# Patient Record
Sex: Male | Born: 1955 | Race: Asian | Hispanic: No | Marital: Married | Smoking: Former smoker
Health system: Southern US, Community
[De-identification: ages and names within clinical notes are randomized; demographics above are authoritative.]

## PROBLEM LIST (undated history)

## (undated) DIAGNOSIS — T7840XA Allergy, unspecified, initial encounter: Secondary | ICD-10-CM

## (undated) DIAGNOSIS — R0683 Snoring: Secondary | ICD-10-CM

## (undated) DIAGNOSIS — N200 Calculus of kidney: Secondary | ICD-10-CM

## (undated) DIAGNOSIS — I10 Essential (primary) hypertension: Secondary | ICD-10-CM

## (undated) DIAGNOSIS — N201 Calculus of ureter: Secondary | ICD-10-CM

## (undated) DIAGNOSIS — R519 Headache, unspecified: Secondary | ICD-10-CM

## (undated) DIAGNOSIS — Z87442 Personal history of urinary calculi: Secondary | ICD-10-CM

## (undated) DIAGNOSIS — I700 Atherosclerosis of aorta: Secondary | ICD-10-CM

## (undated) HISTORY — PX: URETEROLITHOTOMY: SHX71

---

## 2016-05-02 ENCOUNTER — Ambulatory Visit (INDEPENDENT_AMBULATORY_CARE_PROVIDER_SITE_OTHER): Payer: 59

## 2016-05-02 ENCOUNTER — Ambulatory Visit (INDEPENDENT_AMBULATORY_CARE_PROVIDER_SITE_OTHER): Payer: 59 | Admitting: Physician Assistant

## 2016-05-02 VITALS — BP 142/100 | HR 84 | Temp 98.0°F | Ht 69.0 in | Wt 162.6 lb

## 2016-05-02 DIAGNOSIS — M25512 Pain in left shoulder: Secondary | ICD-10-CM | POA: Diagnosis not present

## 2016-05-02 DIAGNOSIS — R030 Elevated blood-pressure reading, without diagnosis of hypertension: Secondary | ICD-10-CM | POA: Diagnosis not present

## 2016-05-02 MED ORDER — PREDNISONE 20 MG PO TABS: ORAL_TABLET | ORAL | 0 refills | Status: DC

## 2016-05-02 NOTE — Progress Notes (Signed)
George Bishop  MRN: [id number] DOB: [birth year]  Subjective:  George Bishop is a 60 y.o. male seen in office today for a chief complaint of left shoulder pain x 3 weeks. Has associated intermittent tingling in left hand and shooting pain in left arm in certain positions. Pain is worsened when he reaches with left hand or when he lies on the affected side. Denies loss of ROM and weakness. 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 Notes that he noticed the pain 3 weeks ago after pulling a large box of ginger from under a counter with his left arm. Also notes that a ~15lb sushi container he was reaching for on a shelf fell and hit his left neck and shoulder 1 week ago. Has tried ibuprofen every 6 hours and pain patches for the past 3 weeks with no relief. States that he will get some relief when he lifts his arm over his head and holds it there.   Review of Systems  Constitutional: Negative for chills, diaphoresis and fever.  Respiratory: Negative for cough and shortness of breath.   Cardiovascular: Negative for chest pain, palpitations and leg swelling.  Gastrointestinal: Negative for nausea and vomiting.  Psychiatric/Behavioral: Negative for confusion.    There are no active problems to display for this patient.   No current outpatient prescriptions on file prior to visit.   No current facility-administered medications on file prior to visit.     No Known Allergies     History reviewed. No pertinent surgical history.  Social History   Social History  . Marital status: Married    Spouse name: N/A  . Number of children: N/A  . Years of education: N/A   Occupational History  . Not on file.   Social History Main Topics  . Smoking status: Never Smoker  . Smokeless tobacco: Never Used  . Alcohol use No  . Drug use: No  . Sexual activity: Not on file   Other Topics Concern  . Not on file   Social History Narrative    . No narrative on file    Objective:  BP (!) 142/100 (BP Location: Left Arm, Patient Position: Sitting, Cuff Size: Normal)   Pulse 84   Temp 98 F (36.7 C) (Oral)   Ht 5\' 9"  (1.753 m)   Wt 162 lb 9.6 oz (73.8 kg)   SpO2 97%   BMI 24.01 kg/m   Physical Exam  Constitutional: He is oriented to person, place, and time and well-developed, well-nourished, and in no distress.  HENT:  Head: Normocephalic and atraumatic.  Eyes: Conjunctivae are normal.  Neck: Normal range of motion.  Pulmonary/Chest: Effort normal.  Musculoskeletal:       Right shoulder: Normal.       Left shoulder: He exhibits tenderness (with palpation of muscle). He exhibits normal range of motion, no bony tenderness, normal pulse and normal strength.       Right elbow: Normal.      Left elbow: Tenderness found. Medial epicondyle tenderness noted.       Right wrist: Normal.       Left wrist: Normal.       Cervical back: He exhibits tenderness (tenderness of left shoulder with flexion and left lateral flexion of cervical region). He exhibits normal range of motion.       Thoracic back: Normal.       Left upper arm: He exhibits  tenderness.       Left forearm: Normal.  Positive Spurling test to the left Positive left should abduction test  Neurological: He is alert and oriented to person, place, and time. He has normal sensation. Gait normal.  Skin: Skin is warm and dry.  Psychiatric: Affect normal.  Vitals reviewed.  No results found for this or any previous visit (from the past 24 hour(s)).  Dg Cervical Spine Complete  Result Date: 05/02/2016 CLINICAL DATA:  15 pounds box fell on neck and left shoulder EXAM: CERVICAL SPINE - COMPLETE 4+ VIEW COMPARISON:  None. FINDINGS: Five views of the cervical spine submitted. No acute fracture or subluxation. Mild disc space flattening with anterior spurring at C5-C6 level. No prevertebral soft tissue swelling. Cervical airway is patent. IMPRESSION: No acute fracture or  subluxation. 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 Electronically Signed   By: Natasha MeadLiviu  Pop M.D.   On: 05/02/2016 17:48   Dg Shoulder Left  Result Date: 05/02/2016 CLINICAL DATA:  Acute left shoulder pain with radiculopathy radiating to the left upper extremity after injury 10 days prior. EXAM: LEFT SHOULDER - 2+ VIEW COMPARISON:  None. FINDINGS: There is no evidence of fracture or dislocation. There is no evidence of arthropathy or other focal bone abnormality. Soft tissues are unremarkable. IMPRESSION: Negative. Electronically Signed   By: Delbert PhenixJason A Poff M.D.   On: 05/02/2016 17:48   No results found for this or any previous visit (from the past 24 hour(s)).  POCT glucose: 96, taken by CMA Remi HaggardJulie Greer Assessment and Plan :  1. Elevated blood pressure reading -Likely due to pain. Will reevaluate at his follow up appointment in 2 weeks as he has never had high bp in the past.   2. Acute pain of left shoulder -History and physical exam consistent with cervical radiculopathy. Will treat with steroids. Pt instructed to follow up in 2 weeks for reevaluation, may warrant an orthopedic referral at this time if no relief. Return sooner if symptoms worsen.  - DG Cervical Spine Complete; Future - DG Shoulder Left; Future - predniSONE (DELTASONE) 20 MG tablet; 3-3-3-2-2-2-1-1-1 taper. Take all pills in the am with food.  Dispense: 18 tablet; Refill: 0 - POCT glucose (manual entry)  Benjiman CoreBrittany Wiseman PA-C  [facility] 05/02/2016 8:17 PM

## 2016-05-02 NOTE — Patient Instructions (Addendum)
Take prednisone as prescribed. Use ice on your neck and shoulder at least 3 times a day for 20 minutes at a time.   Return to clinic in 2 weeks for blood pressure follow up. We will reevaluated your shoulder pain at this point. If no improvement, you may benefit from an ortho referral.   If symptoms worsen, seek care sooner.    Cervical Radiculopathy Introduction Cervical radiculopathy means that a nerve in the neck is pinched or bruised. This can cause pain or loss of feeling (numbness) that runs from your neck to your arm and fingers. Follow these instructions at home: Managing pain  Take over-the-counter and prescription medicines only as told by your doctor.  If directed, put ice on the injured or painful area.  Put ice in a plastic bag.  Place a towel between your skin and the bag.  Leave the ice on for 20 minutes, 2-3 times per day.  If ice does not help, you can try using heat. Take a warm shower or warm bath, or use a heat pack as told by your doctor.  You may try a gentle neck and shoulder massage. Activity  Rest as needed. Follow instructions from your doctor about any activities to avoid.  Do exercises as told by your doctor or physical therapist. General instructions  If you were given a soft collar, wear it as told by your doctor.  Use a flat pillow when you sleep.  Keep all follow-up visits as told by your doctor. This is important. Contact a doctor if:  Your condition does not improve with treatment. Get help right away if:  Your pain gets worse and is not controlled with medicine.  You lose feeling or feel weak in your hand, arm, face, or leg.  You have a fever.  You have a stiff neck.  You cannot control when you poop or pee (have incontinence).  You have trouble with walking, balance, or talking. This information is not intended to replace advice given to you by your health care provider. Make sure you discuss any questions you have with your  health care provider. Document Released: 04/11/2011 Document Revised: 09/28/2015 Document Reviewed: 06/16/2014  2017 Elsevier    IF you received an x-ray today, you will receive an invoice from [facility]. Please contact [facility] at [phone] with questions or concerns regarding your invoice.   IF you received labwork today, you will receive an invoice from [facility]. Please contact [facility] at [phone] with questions or concerns regarding your invoice.   Our billing staff will not be able to assist you with questions regarding bills from these companies.  You will be contacted with the lab results as soon as they are available. The fastest way to get your results is to activate your My Chart account. Instructions are located on the last page of this paperwork. If you have not heard from us regarding the results in 2 weeks, please contact this office.

## 2016-05-25 ENCOUNTER — Ambulatory Visit (INDEPENDENT_AMBULATORY_CARE_PROVIDER_SITE_OTHER): Payer: 59 | Admitting: Family Medicine

## 2016-05-25 VITALS — BP 122/72 | HR 91 | Temp 98.3°F | Resp 17 | Ht 69.5 in | Wt 165.0 lb

## 2016-05-25 DIAGNOSIS — M5410 Radiculopathy, site unspecified: Secondary | ICD-10-CM | POA: Diagnosis not present

## 2016-05-25 DIAGNOSIS — G5692 Unspecified mononeuropathy of left upper limb: Secondary | ICD-10-CM

## 2016-05-25 NOTE — Progress Notes (Signed)
Subjective:  By signing my name below, I, Stann Oresung-Kai Tsai, attest that this documentation has been prepared under the direction and in the presence of Norberto SorensonEva Shaw, MD. Electronically Signed: Stann Oresung-Kai Tsai, Scribe. 05/25/2016 , 12:16 PM .  Patient was seen in Room 10 .   Patient ID: George Bishop, male    DOB: Aug 02, 1955, 61 y.o.   MRN: [id number] Chief Complaint  Patient presents with  . Shoulder Pain    left side    HPI George Bishop is a 61 y.o. male who [facility] complaining of left sided shoulder pain. He was seen by Benjiman CoreBrittany Wiseman, PA-C 3 weeks ago for left shoulder pain with intermittent radiculopathy symptoms. It began after several work related mechanical injuries and presented with failed response to routine anti-inflammatories. He was put on 9-day prednisone 60mg  taper. His C-spine xray showed mild degenerative change at C5-C6 and normal left shoulder. His BP is improved today.   Patient reports that he took prednisone, but no relief from it. He's been taking 2 tablets of ibuprofen 2~3 times a day for some mild relief. He is able to have rest and sleep after taking ibuprofen at night. He also reports some pain and tingling down into his left 1st, 2nd through 5th digits in ulnar aspect. He denies any abdominal pain or heartburn. He's been drinking plenty of water; denies any alcohol use. He denies prior injuries similar to this. He is right hand dominant.   Patient's primary language is Burmese, but is able to speak and respond to [place]. A [employer] video interpreter was called for some assistance.   No past medical history on file. Prior to Admission medications   Medication Sig Start Date End Date Taking? Authorizing Provider  ibuprofen (ADVIL,MOTRIN) 200 MG tablet Take 200 mg by mouth every 6 (six) hours as needed.   Yes Historical Provider, MD   No Known Allergies  Review of Systems  Constitutional: Negative for fatigue and unexpected weight  change.  Eyes: Negative for visual disturbance.  Respiratory: Negative for cough, chest tightness and shortness of breath.   Cardiovascular: Negative for chest pain, palpitations and leg swelling.  Gastrointestinal: Negative for abdominal pain and blood in stool.  Musculoskeletal: Positive for arthralgias, myalgias and neck pain.  Neurological: Positive for numbness. Negative for dizziness, light-headedness and headaches.       Objective:   Physical Exam  Constitutional: He is oriented to person, place, and time. He appears well-developed and well-nourished. No distress.  HENT:  Head: Normocephalic and atraumatic.  Eyes: EOM are normal. Pupils are equal, round, and reactive to light.  Neck: Neck supple.  Cardiovascular: Normal rate.   Pulmonary/Chest: Effort normal. No respiratory distress.  Musculoskeletal: Normal range of motion.  No tenderness in cervical spinous process, very spasmed, tight left cervical paraspinal, positive Spurling test; flexion and left rotation of c-spine causes pain in upper rhomboid medial to the scapula, which does have palpable swelling and spasm; tenderness to palpation above his mid-scapula inside the triangular muscle openings; point tenderness just at distal posterior aspect of the axilla; 5/5 grasp strength wrist extension, flexion of bicep and tricep  Neurological: He is alert and oriented to person, place, and time.  Reflex Scores:      Tricep reflexes are 2+ on the right side and 1+ on the left side.      Bicep reflexes are 2+ on the right side and 1+ on the left side.  Brachioradialis reflexes are 2+ on the right side and 1+ on the left side. Left DTR: trace  Skin: Skin is warm and dry.  Psychiatric: He has a normal mood and affect. His behavior is normal.  Nursing note and vitals reviewed.   BP 122/72 (BP Location: Right Arm, Patient Position: Sitting, Cuff Size: Normal)   Pulse 91   Temp 98.3 F (36.8 C) (Oral)   Resp 17   Ht 5' 9.5"  (1.765 m)   Wt 165 lb (74.8 kg)   SpO2 98%   BMI 24.02 kg/m     Assessment & Plan:   1. Radiculopathy affecting upper extremity   2. Neuropathy of upper extremity, left     Orders Placed This Encounter  Procedures  . Ambulatory referral to Orthopedic Surgery    Referral Priority:   Urgent    Referral Type:   Surgical    Referral Reason:   Specialty Services Required    Requested Specialty:   Orthopedic Surgery    Number of Visits Requested:   1     I personally performed the services described in this documentation, which was scribed in my presence. The recorded information has been reviewed and considered, and addended by me as needed.   Norberto Sorenson, M.D.  [facility] [address] [place], [place] [address] [phone] phone [phone] fax  06/04/16 10:23 PM

## 2016-05-25 NOTE — Patient Instructions (Addendum)
   IF you received an x-ray today, you will receive an invoice from [facility]. Please contact [facility] at [phone] with questions or concerns regarding your invoice.   IF you received labwork today, you will receive an invoice from [facility]. Please contact [facility] at [phone] with questions or concerns regarding your invoice.   Our billing staff will not be able to assist you with questions regarding bills from these companies.  You will be contacted with the lab results as soon as they are available. The fastest way to get your results is to activate your My Chart account. Instructions are located on the last page of this paperwork. If you have not heard from us regarding the results in 2 weeks, please contact this office.     Pinched Nerve Introduction A pinched nerve is a type of injury that occurs when too much pressure is placed on a nerve. This pressure can cause pain, burning, and muscle weakness in places such as your arm, hand, back, leg, or neck. A nerve can become permanently damaged if it is severely pinched or if it has been pinched for a long time. What are the causes? This condition may be caused by:  The passing of a nerve through a narrow area between bones or other body structures.  Loss of blood supply to a nerve.  A nerve being stretched from an injury.  A sudden injury with swelling.  Wear and tear that occurs over several years.  Changes that occur in the spine with age. What are the signs or symptoms? The most common symptom of a pinched nerve is a tingling feeling or numbness. Other symptoms include:  Pain that radiates from the affected nerve to the body part that the nerve supplies.  A burning feeling.  Muscle weakness in the muscles supplied by the injured nerve. How is this diagnosed? This condition is diagnosed with a physical exam. During the exam, a health care provider will check for numbness and muscle weakness  and move the affected body parts to test for pain. You may also have other tests, such as:  X-rays to check for bone damage.  An MRI or CT scan to check for nerve damage.  Electromyography (EMG) to check for electrical signals passing through nerves to muscles. How is this treated? The first treatment for a pinched nerve is usually rest and supportive devices, such as a splint, brace, or neck collar. Additional treatment depends on symptoms and the amount of nerve damage. This can include:  Medicines, such as:  Numbing medicine injections.  Nonsteroidal anti-inflammatory drugs (NSAIDs).  Steroid medicines in pill form or by injection.  Physical therapy to relieve pain, maintain movement, and improve muscle strength.  Surgery. This may be done if other treatments do not work. Follow these instructions at home:  Only take medicines as directed by your health care provider.  Wear supportive or protective devices as directed by your health care provider.  Do stretching and strengthening exercises at home as directed by your health care provider.  Rest as needed.  Keep all follow-up visits as directed by your health care provider. This is important. Contact a health care provider if:  Your condition does not improve with treatment.  Your pain, numbness, or weakness suddenly gets worse. This information is not intended to replace advice given to you by your health care provider. Make sure you discuss any questions you have with your health care provider. Document Released: 04/12/2002 Document Revised: 09/28/2015   Document Reviewed: 01/26/2014  2017 Elsevier  

## 2016-06-03 ENCOUNTER — Ambulatory Visit (INDEPENDENT_AMBULATORY_CARE_PROVIDER_SITE_OTHER): Payer: 59 | Admitting: Orthopaedic Surgery

## 2016-06-03 ENCOUNTER — Encounter (INDEPENDENT_AMBULATORY_CARE_PROVIDER_SITE_OTHER): Payer: Self-pay | Admitting: Orthopaedic Surgery

## 2016-06-03 DIAGNOSIS — M5412 Radiculopathy, cervical region: Secondary | ICD-10-CM | POA: Insufficient documentation

## 2016-06-03 MED ORDER — CYCLOBENZAPRINE HCL 5 MG PO TABS: 5.0000 mg | ORAL_TABLET | Freq: Three times a day (TID) | ORAL | 3 refills | Status: DC | PRN

## 2016-06-03 NOTE — Progress Notes (Signed)
    Office Visit Note   Patient: George Bishop           Date of Birth: 1955-12-05           MRN: [id number] Visit Date:               Requested by: Sherren MochaEva N Shaw, MD [address] [place], [place] [address] PCP: No primary care provider on file.   Assessment & Plan: Visit Diagnoses:  1. Cervical radiculopathy   2. Acute pain of left shoulder     Plan: Recommend MRI of cervical spine to evaluate for structural abnormality. Follow-up after the MRI  Follow-Up Instructions: Return in about 2 weeks (around 06/17/2016).   Orders:  No orders of the defined types were placed in this encounter.  Meds ordered this encounter  Medications  . DISCONTD: cyclobenzaprine (FLEXERIL) 5 MG tablet    Sig: Take 1-2 tablets (5-10 mg total) by mouth 3 (three) times daily as needed for muscle spasms.    Dispense:  30 tablet    Refill:  3  . cyclobenzaprine (FLEXERIL) 5 MG tablet    Sig: Take 1-2 tablets (5-10 mg total) by mouth 3 (three) times daily as needed for muscle spasms.    Dispense:  30 tablet    Refill:  3      Procedures: No procedures performed   Clinical Data: No additional findings.   Subjective: Chief Complaint  Patient presents with  . Left Shoulder - Pain    Patient is a Burmese 61 year old gentleman who has had cervical radiculitis into his left upper extremity for about 2 months. He's been treated conservatively by urgent care with NSAIDs and prednisone taper with minimal relief. Patient denies any focal motor or sensory deficits. He endorses muscular pain. Denies any constitutional symptoms or injuries.    Review of Systems Complete review of systems negative except for history of present illness  Objective: Vital Signs: There were no vitals taken for this visit.  Physical Exam  Constitutional: He is oriented to person, place, and time. He appears well-developed and well-nourished.  HENT:  Head: Normocephalic and atraumatic.  Eyes: Pupils are equal, round,  and reactive to light.  Neck: Neck supple.  Pulmonary/Chest: Effort normal.  Abdominal: Soft.  Musculoskeletal: Normal range of motion.  Neurological: He is alert and oriented to person, place, and time.  Skin: Skin is warm.  Psychiatric: He has a normal mood and affect. His behavior is normal. Judgment and thought content normal.  Nursing note and vitals reviewed.   Ortho Exam Exam of the cervical spine shows normal range of motion. Negative Spurling sign. He has no focal deficits. Normal reflexes. Shoulder exam is normal. Specialty Comments:  No specialty comments available.  Imaging: No results found.   PMFS History: Patient Active Problem List   Diagnosis Date Noted  . Cervical radiculopathy 06/03/2016   No past medical history on file.  No family history on file.  No past surgical history on file. Social History   Occupational History  . Not on file.   Social History Main Topics  . Smoking status: Never Smoker  . Smokeless tobacco: Never Used  . Alcohol use No  . Drug use: No  . Sexual activity: Not on file

## 2016-06-13 ENCOUNTER — Ambulatory Visit
Admission: RE | Admit: 2016-06-13 | Discharge: 2016-06-13 | Disposition: A | Discharge status: Home / Self Care | Payer: 59 | Source: Ambulatory Visit | Attending: Orthopaedic Surgery | Admitting: Orthopaedic Surgery

## 2016-06-13 DIAGNOSIS — M5412 Radiculopathy, cervical region: Secondary | ICD-10-CM

## 2016-06-17 ENCOUNTER — Ambulatory Visit (INDEPENDENT_AMBULATORY_CARE_PROVIDER_SITE_OTHER): Payer: 59 | Admitting: Orthopaedic Surgery

## 2016-06-17 ENCOUNTER — Encounter (INDEPENDENT_AMBULATORY_CARE_PROVIDER_SITE_OTHER): Payer: Self-pay | Admitting: Orthopaedic Surgery

## 2016-06-17 DIAGNOSIS — M5412 Radiculopathy, cervical region: Secondary | ICD-10-CM

## 2016-06-17 MED ORDER — PREDNISONE 10 MG (21) PO TBPK: ORAL_TABLET | ORAL | 0 refills | Status: DC

## 2016-06-17 MED ORDER — MELOXICAM 7.5 MG PO TABS: 7.5000 mg | ORAL_TABLET | Freq: Two times a day (BID) | ORAL | 2 refills | Status: DC | PRN

## 2016-06-17 NOTE — Progress Notes (Signed)
   Office Visit Note   Patient: George Bishop           Date of Birth: 04/20/1956           MRN: [id number] Visit Date: 06/17/2016              Requested by: No referring provider defined for this encounter. PCP: No PCP Per Patient   Assessment & Plan: Visit Diagnoses:  1. Cervical radiculopathy     Plan: pred pak, mobic, rest.  If not better in 4 weeks patient will call and we will refer [facility] Alvester [facility]Newton [facility]  Follow-Up Instructions: Return in about 4 weeks (around 07/15/2016), or if symptoms worsen or fail to improve.   Orders:  No orders of the defined types were placed in this encounter.  Meds ordered this encounter  Medications  . predniSONE (STERAPRED UNI-PAK 21 TAB) 10 MG (21) TBPK tablet    Sig: Take as directed    Dispense:  21 tablet    Refill:  0  . meloxicam (MOBIC) 7.5 MG tablet    Sig: Take 1 tablet (7.5 mg total) by mouth 2 (two) times daily as needed for pain.    Dispense:  30 tablet    Refill:  2      Procedures: No procedures performed   Clinical Data: No additional findings.   Subjective: Chief Complaint  Patient presents with  . Neck - Pain, Follow-up    Patient f/u today for cervical radiculopathy feeling slightly better.  Here to review MRI.    Review of Systems   Objective: Vital Signs: There were no vitals taken for this visit.  Physical Exam  Ortho Exam Exam shows no focal weakness or sensory deficits  Specialty Comments:  No specialty comments available.  Imaging: No results found.   PMFS History: Patient Active Problem List   Diagnosis Date Noted  . Cervical radiculopathy 06/03/2016   History reviewed. No pertinent past medical history.  History reviewed. No pertinent family history.  History reviewed. No pertinent surgical history. Social History   Occupational History  . Not on file.   Social History Main Topics  . Smoking status: Never Smoker  . Smokeless tobacco: Never Used  . Alcohol use No  .  Drug use: No  . Sexual activity: Not on file

## 2016-08-05 ENCOUNTER — Other Ambulatory Visit (INDEPENDENT_AMBULATORY_CARE_PROVIDER_SITE_OTHER): Payer: Self-pay | Admitting: Orthopaedic Surgery

## 2016-08-06 NOTE — Telephone Encounter (Signed)
Please advise 

## 2017-09-22 ENCOUNTER — Ambulatory Visit: Payer: BLUE CROSS/BLUE SHIELD | Admitting: Family Medicine

## 2017-09-22 ENCOUNTER — Other Ambulatory Visit: Payer: Self-pay

## 2017-09-22 ENCOUNTER — Encounter: Payer: Self-pay | Admitting: Family Medicine

## 2017-09-22 VITALS — BP 141/94 | HR 98 | Temp 99.3°F | Resp 17 | Ht 69.5 in | Wt 154.6 lb

## 2017-09-22 DIAGNOSIS — J029 Acute pharyngitis, unspecified: Secondary | ICD-10-CM

## 2017-09-22 DIAGNOSIS — H6983 Other specified disorders of Eustachian tube, bilateral: Secondary | ICD-10-CM

## 2017-09-22 DIAGNOSIS — J301 Allergic rhinitis due to pollen: Secondary | ICD-10-CM

## 2017-09-22 LAB — POCT RAPID STREP A (OFFICE): Rapid Strep A Screen: NEGATIVE

## 2017-09-22 MED ORDER — CETIRIZINE HCL 10 MG PO TABS: 10.0000 mg | ORAL_TABLET | Freq: Every day | ORAL | 11 refills | Status: DC

## 2017-09-22 MED ORDER — PREDNISONE 20 MG PO TABS: 40.0000 mg | ORAL_TABLET | Freq: Every day | ORAL | 0 refills | Status: DC

## 2017-09-22 MED ORDER — FLUTICASONE PROPIONATE 50 MCG/ACT NA SUSP
2.0000 | Freq: Every day | NASAL | 6 refills | Status: DC
Start: 2017-09-22 — End: 2018-01-02

## 2017-09-22 NOTE — Patient Instructions (Addendum)
Take prednisone  (that is 2 tablets) with food in the morning for 4 days This will help the congestion, ear fullness and cough  Take cetrizine  at bedtime  Take flonase 2 sprays each nostril at bedtime   Continue tylenol as needed    IF you received an x-ray today, you will receive an invoice from [facility]. Please contact [facility] at [phone] with questions or concerns regarding your invoice.   IF you received labwork today, you will receive an invoice from [place]. Please contact [facility] at [phone] with questions or concerns regarding your invoice.   Our billing staff will not be able to assist you with questions regarding bills from these companies.  You will be contacted with the lab results as soon as they are available. The fastest way to get your results is to activate your My Chart account. Instructions are located on the last page of this paperwork. If you have not heard from [place] regarding the results in 2 weeks, please contact this office.     Upper Respiratory Infection, Adult Most upper respiratory infections (URIs) are a viral infection of the air passages leading to the lungs. A URI affects the nose, throat, and upper air passages. The most common type of URI is nasopharyngitis and is typically referred to as "the common cold." URIs run their course and usually go away on their own. Most of the time, a URI does not require medical attention, but sometimes a bacterial infection in the upper airways can follow a viral infection. This is called a secondary infection. Sinus and middle ear infections are common types of secondary upper respiratory infections. Bacterial pneumonia can also complicate a URI. A URI can worsen asthma and chronic obstructive pulmonary disease (COPD). Sometimes, these complications can require emergency medical care and may be life threatening. What are the causes? Almost all URIs are caused by viruses. A virus is a  type of germ and can spread from one person to another. What increases the risk? You may be at risk for a URI if:  You smoke.  You have chronic heart or lung disease.  You have a weakened defense (immune) system.  You are very young or very old.  You have nasal allergies or asthma.  You work in crowded or poorly ventilated areas.  You work in health care facilities or schools.  What are the signs or symptoms? Symptoms typically develop 2-3 days after you come in contact with a cold virus. Most viral URIs last 7-10 days. However, viral URIs from the influenza virus (flu virus) can last 14-18 days and are typically more severe. Symptoms may include:  Runny or stuffy (congested) nose.  Sneezing.  Cough.  Sore throat.  Headache.  Fatigue.  Fever.  Loss of appetite.  Pain in your forehead, behind your eyes, and over your cheekbones (sinus pain).  Muscle aches.  How is this diagnosed? Your health care provider may diagnose a URI by:  Physical exam.  Tests to check that your symptoms are not due to another condition such as: ? Strep throat. ? Sinusitis. ? Pneumonia. ? Asthma.  How is this treated? A URI goes away on its own with time. It cannot be cured with medicines, but medicines may be prescribed or recommended to relieve symptoms. Medicines may help:  Reduce your fever.  Reduce your cough.  Relieve nasal congestion.  Follow these instructions at home:  Take medicines only as directed by your health care provider.  Gargle warm  saltwater or take cough drops to comfort your throat as directed by your health care provider.  Use a warm mist humidifier or inhale steam from a shower to increase air moisture. This may make it easier to breathe.  Drink enough fluid to keep your urine clear or pale yellow.  Eat soups and other clear broths and maintain good nutrition.  Rest as needed.  Return to work when your temperature has returned to normal or as  your health care provider advises. You may need to stay home longer to avoid infecting others. You can also use a face mask and careful hand washing to prevent spread of the virus.  Increase the usage of your inhaler if you have asthma.  Do not use any tobacco products, including cigarettes, chewing tobacco, or electronic cigarettes. If you need help quitting, ask your health care provider. How is this prevented? The best way to protect yourself from getting a cold is to practice good hygiene.  Avoid oral or hand contact with people with cold symptoms.  Wash your hands often if contact occurs.  There is no clear evidence that vitamin C, vitamin E, echinacea, or exercise reduces the chance of developing a cold. However, it is always recommended to get plenty of rest, exercise, and practice good nutrition. Contact a health care provider if:  You are getting worse rather than better.  Your symptoms are not controlled by medicine.  You have chills.  You have worsening shortness of breath.  You have brown or red mucus.  You have yellow or brown nasal discharge.  You have pain in your face, especially when you bend forward.  You have a fever.  You have swollen neck glands.  You have pain while swallowing.  You have white areas in the back of your throat. Get help right away if:  You have severe or persistent: ? Headache. ? Ear pain. ? Sinus pain. ? Chest pain.  You have chronic lung disease and any of the following: ? Wheezing. ? Prolonged cough. ? Coughing up blood. ? A change in your usual mucus.  You have a stiff neck.  You have changes in your: ? Vision. ? Hearing. ? Thinking. ? Mood. This information is not intended to replace advice given to you by your health care provider. Make sure you discuss any questions you have with your health care provider. Document Released: 10/16/2000 Document Revised: 12/24/2015 Document Reviewed: 07/28/2013 Elsevier Interactive  Patient Education  [employer].

## 2017-09-22 NOTE — Progress Notes (Signed)
Chief Complaint  Patient presents with  . Sore Throat    x 3-4 days, taking tylenol for symptoms  . Cough    x 3-4 days   Social History   Social History Narrative  . Not on file    Subjective: George Bishop is a 62 y.o. who comes in today for a URI. This has been going on for 4 days and feels like he got it from his wife The worst part is cough and sore throat . It is getting worse  he has had sick exposure(s) or recent travel.  Review of systems for fever is positive. Respiratory ROS: positive for - cough and shortness of breath ENT ROS: positive for - hearing change and nasal congestion negative for - epistaxis, headaches, nasal discharge, nasal polyps, tinnitus or vertigo   Physical Exam: Vitals:   09/22/17 1136  BP: (!) 141/94  Pulse: 98  Resp: 17  Temp: 99.3 F (37.4 C)  SpO2: 97%    General: alert, oriented, in NAD Head: normocephalic, atraumatic, no sinus tenderness Eyes: EOM intact, no scleral icterus or conjunctival injection Ears: TM clear bilaterally with bulging on the right and mild erythema in the external canal on the left Nose: mucosa +erythematous and edematous Throat: no pharyngeal exudate or erythema Lymph: + posterior auricular, +submental  No cervical lymph adenopathy Heart: normal rate, normal sinus rhythm, no murmurs Lungs: clear to auscultation bilaterally, no wheezing   Assessment and Plan: George Bishop was seen today for sore throat and cough.  Diagnoses and all orders for this visit:  Acute pharyngitis, unspecified etiology -     POCT rapid strep A -     predniSONE (DELTASONE) 20 MG tablet; Take 2 tablets (40 mg total) by mouth daily with breakfast for 4 days. -     fluticasone (FLONASE) 50 MCG/ACT nasal spray; Place 2 sprays into both nostrils daily. -     cetirizine (ZYRTEC) 10 MG tablet; Take 1 tablet (10 mg total) by mouth daily.  Seasonal allergic rhinitis due to pollen -     predniSONE (DELTASONE) 20 MG tablet; Take 2 tablets  (40 mg total) by mouth daily with breakfast for 4 days. -     fluticasone (FLONASE) 50 MCG/ACT nasal spray; Place 2 sprays into both nostrils daily. -     cetirizine (ZYRTEC) 10 MG tablet; Take 1 tablet (10 mg total) by mouth daily.  Dysfunction of both eustachian tubes -     predniSONE (DELTASONE) 20 MG tablet; Take 2 tablets (40 mg total) by mouth daily with breakfast for 4 days. -     fluticasone (FLONASE) 50 MCG/ACT nasal spray; Place 2 sprays into both nostrils daily. -     cetirizine (ZYRTEC) 10 MG tablet; Take 1 tablet (10 mg total) by mouth daily.    Rapid strep negative Take prednisone  (that is 2 tablets) with food in the morning for 4 days This will help the congestion, ear fullness and cough  Take cetrizine  at bedtime  Take flonase 2 sprays each nostril at bedtime   Continue tylenol as needed Return to clinic if worsens or new symptoms

## 2017-09-25 ENCOUNTER — Telehealth: Payer: Self-pay | Admitting: Family Medicine

## 2017-09-25 NOTE — Telephone Encounter (Signed)
Pt came in and states that he is still coughing at night time and it is hard for him to sleep.  He is asking if Creta Levin could call in something to help him sleep.  Pt was seen on the 20th of this month.  Please advise

## 2017-09-26 ENCOUNTER — Other Ambulatory Visit: Payer: Self-pay

## 2017-09-26 ENCOUNTER — Ambulatory Visit: Payer: BLUE CROSS/BLUE SHIELD | Admitting: Physician Assistant

## 2017-09-26 ENCOUNTER — Encounter: Payer: Self-pay | Admitting: Physician Assistant

## 2017-09-26 VITALS — BP 140/96 | HR 77 | Temp 97.9°F | Ht 69.0 in | Wt 158.2 lb

## 2017-09-26 DIAGNOSIS — R059 Cough, unspecified: Secondary | ICD-10-CM

## 2017-09-26 DIAGNOSIS — J40 Bronchitis, not specified as acute or chronic: Secondary | ICD-10-CM | POA: Diagnosis not present

## 2017-09-26 DIAGNOSIS — R05 Cough: Secondary | ICD-10-CM

## 2017-09-26 MED ORDER — AZITHROMYCIN 250 MG PO TABS: ORAL_TABLET | ORAL | 0 refills | Status: DC

## 2017-09-26 MED ORDER — BENZONATATE 200 MG PO CAPS: 200.0000 mg | ORAL_CAPSULE | Freq: Two times a day (BID) | ORAL | 0 refills | Status: DC | PRN

## 2017-09-26 MED ORDER — HYDROCODONE-HOMATROPINE 5-1.5 MG/5ML PO SYRP: 5.0000 mL | ORAL_SOLUTION | Freq: Three times a day (TID) | ORAL | 0 refills | Status: DC | PRN

## 2017-09-26 NOTE — Patient Instructions (Addendum)
Start taking Azithromycin - this is an antibiotic. Take all 5 days of this medicine, even if you start to feel better sooner.   To help calm your cough: Hycodan - take this as directed to help you sleep at night. This medicine will make you drowsy. Benzonatate - take this as needed during the day for cough. This will not make you drowsy.   Come back if you are not improving in 5-7 days.    Stay well hydrated. Get lost of rest. Wash your hands often.   -Foods that can help speed recovery: honey, garlic, chicken soup, elderberries, green tea.  -Supplements that can help speed recovery: vitamin C, zinc, elderberry extract, quercetin, ginseng, selenium -Supplement with prebiotics and probiotics:   Advil or ibuprofen for pain. Do not take Aspirin.  Drink enough water and fluids to keep your urine clear or pale yellow.  For sore throat: ? Gargle with 8 oz of salt water ( tsp of salt per 1 qt of water) as often as every 1-2 hours to soothe your throat.  Gargle liquid benadryl.  Cepacol throat lozenges (if you are not at risk for choking).  For sore throat try using a honey-based tea. Use 3 teaspoons of honey with juice squeezed from half lemon. Place shaved pieces of ginger into 1/2-1 cup of water and warm over stove top. Then mix the ingredients and repeat every 4 hours as needed.  Cough Syrup Recipe: Sweet Lemon & Honey Thyme  Ingredients a handful of fresh thyme sprigs   1 pint of water (2 cups)  1/2 cup honey (raw is best, but regular will do)  1/2 lemon chopped Instructions 1. Place the lemon in the pint jar and cover with the honey. The honey will macerate the lemons and draw out liquids which taste so delicious! 2. Meanwhile, toss the thyme leaves into a saucepan and cover them with the water. 3. Bring the water to a gentle simmer and reduce it to half, about a cup of tea. 4. When the tea is reduced and cooled a bit, strain the sprigs & leaves, add it into the pint jar and stir it  well. 5. Give it a shake and use a spoonful as needed. 6. Store your homemade cough syrup in the refrigerator for about a month.  What causes a cough? In adults, common causes of a cough include: ?An infection of the airways or lungs (such as the common cold) ?Postnasal drip - Postnasal drip is when mucus from the nose drips down or flows along the back of the throat. Postnasal drip can happen when people have: .A cold .Allergies .A sinus infection - The sinuses are hollow areas in the bones of the face that open into the nose. ?Lung conditions, like asthma and chronic obstructive pulmonary disease (COPD) - Both of these conditions can make it hard to breathe. COPD is usually caused by smoking. ?Acid reflux - Acid reflux is when the acid that is normally in your stomach backs up into your esophagus (the tube that carries food from your mouth to your stomach). ?A side effect from blood pressure medicines called "ACE inhibitors" ?Smoking cigarettes  Is there anything I can do on my own to get rid of my cough? Yes. To help get rid of your cough, you can: ?Use a humidifier in your bedroom ?Use an over-the-counter cough medicine, or suck on cough drops or hard candy ?Stop smoking, if you smoke ?If you have allergies, avoid the things you are  allergic to (like pollen, dust, animals, or mold) If you have acid reflux, your doctor or nurse will tell you which lifestyle changes can help reduce symptoms.      IF you received an x-ray today, you will receive an invoice from [facility]. Please contact [facility] at [phone] with questions or concerns regarding your invoice.   IF you received labwork today, you will receive an invoice from [place]. Please contact [facility] at [phone] with questions or concerns regarding your invoice.   Our billing staff will not be able to assist you with questions regarding bills from these companies.  You will be contacted with  the lab results as soon as they are available. The fastest way to get your results is to activate your My Chart account. Instructions are located on the last page of this paperwork. If you have not heard from [place] regarding the results in 2 weeks, please contact this office.

## 2017-09-26 NOTE — Progress Notes (Signed)
   George Bishop  MRN: [id number] DOB: June 05, 1955  PCP: Patient, No Pcp Per  Subjective:  Pt is a 62 year old male who presents to clinic for f/u cough. He was here on 5/20 and treated supportively for acute pharyngitis.  Today he c/o worsening cough. Cough has been presents for >8 days. Cough is productive and is keeping him up at night.  ROS below.   Review of Systems  Constitutional: Negative for chills, diaphoresis, fatigue and fever.  HENT: Positive for congestion.   Respiratory: Positive for cough and chest tightness. Negative for shortness of breath and wheezing.   Cardiovascular: Negative for chest pain, palpitations and leg swelling.  Psychiatric/Behavioral: Positive for sleep disturbance.    Patient Active Problem List   Diagnosis Date Noted  . Cervical radiculopathy 06/03/2016    Current Outpatient Medications on File Prior to Visit  Medication Sig Dispense Refill  . cetirizine (ZYRTEC) 10 MG tablet Take 1 tablet (10 mg total) by mouth daily. 30 tablet 11  . fluticasone (FLONASE) 50 MCG/ACT nasal spray Place 2 sprays into both nostrils daily. 16 g 6  . cyclobenzaprine (FLEXERIL) 5 MG tablet Take 1-2 tablets (5-10 mg total) by mouth 3 (three) times daily as needed for muscle spasms. (Patient not taking: Reported on 09/22/2017) 30 tablet 3  . meloxicam (MOBIC) 7.5 MG tablet TAKE 1 TABLET (7.5 MG TOTAL) BY MOUTH 2 (TWO) TIMES DAILY AS NEEDED FOR PAIN. (Patient not taking: Reported on 09/22/2017) 30 tablet 2   No current facility-administered medications on file prior to visit.     No Known Allergies   Objective:  BP (!) 140/94 (BP Location: Left Arm, Patient Position: Sitting, Cuff Size: Normal)   Pulse 77   Temp 97.9 F (36.6 C) (Oral)   Ht  (1.753 m)   Wt 158 lb 3.2 oz (71.8 kg)   SpO2 99%   BMI 23.36 kg/m   Physical Exam  Constitutional: He is oriented to person, place, and time. He appears well-developed and well-nourished.  Cardiovascular: Normal rate  and regular rhythm.  Pulmonary/Chest: Effort normal. No respiratory distress. He has rhonchi in the right lower field.  Neurological: He is alert and oriented to person, place, and time.  Skin: Skin is warm and dry.  Psychiatric: He has a normal mood and affect. His behavior is normal. Judgment and thought content normal.  Vitals reviewed.   Assessment and Plan :  1. Bronchitis - azithromycin (ZITHROMAX) 250 MG tablet; Take 2 tabs PO x 1 dose, then 1 tab PO QD x 4 days  Dispense: 6 tablet; Refill: 0 - pt presents for worsening cough x 8 days. He was here last week and treated supportively. Plan to cover with z-pack and treat supportively with hycodan, tessalon and hydration. RTC in 5-7 days if no improvement.  2. Cough - HYDROcodone-homatropine (HYCODAN) 5-1.5 MG/5ML syrup; Take 5 mLs by mouth every 8 (eight) hours as needed for cough.  Dispense: 120 mL; Refill: 0 - benzonatate (TESSALON) 200 MG capsule; Take 1 capsule (200 mg total) by mouth 2 (two) times daily as needed for cough.  Dispense: 20 capsule; Refill: 0   Marco Collie, PA-C  Primary [facility] at [facility] 09/26/2017 11:17 AM

## 2017-09-26 NOTE — Telephone Encounter (Signed)
Pt seen today by mcvey.

## 2017-10-01 IMAGING — MR MR CERVICAL SPINE W/O CM
4 of 5 series · 28 of 48 positions shown · non-contrast
Comparison: None.

CLINICAL DATA: Left-sided neck pain, arm pain for 2 months

EXAM:
MRI CERVICAL SPINE WITHOUT CONTRAST
TECHNIQUE: Multiplanar, multisequence MR imaging of the cervical spine was
performed. No intravenous contrast was administered.

[Series 3: T2 · sagittal · 3.3mm · 0.39mm/px · 6 of 12 slices shown (1 of 2)]
[im 1/12]
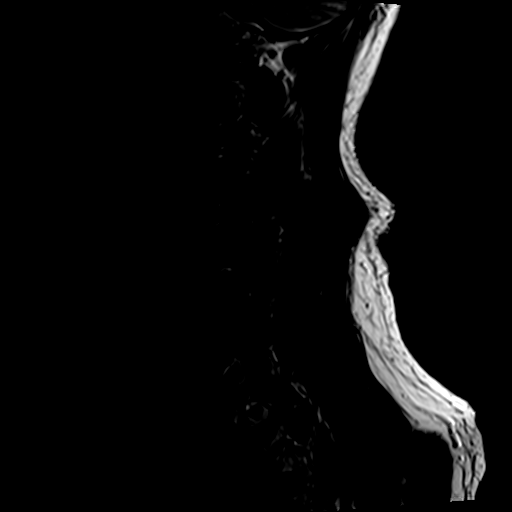
[im 3/12]
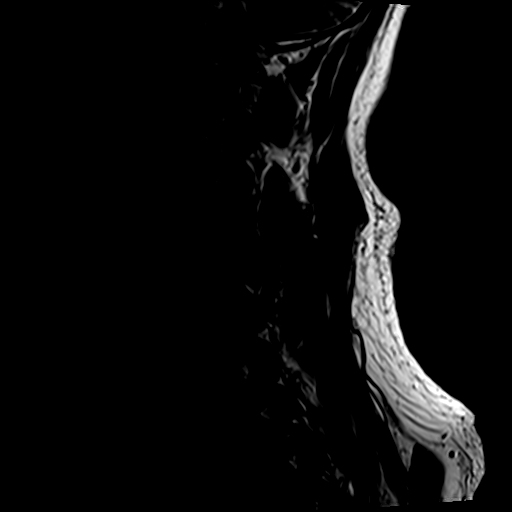
[im 5/12]
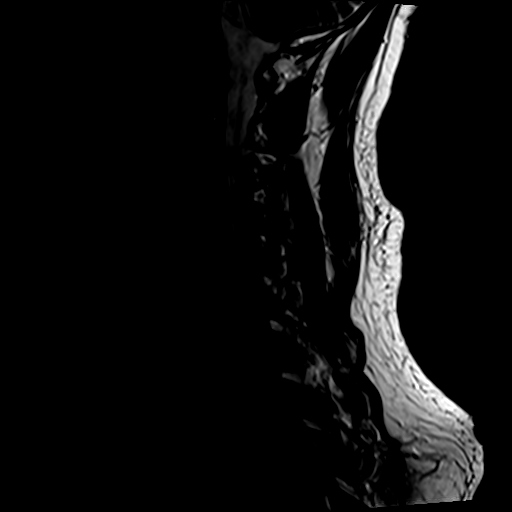
[im 7/12]
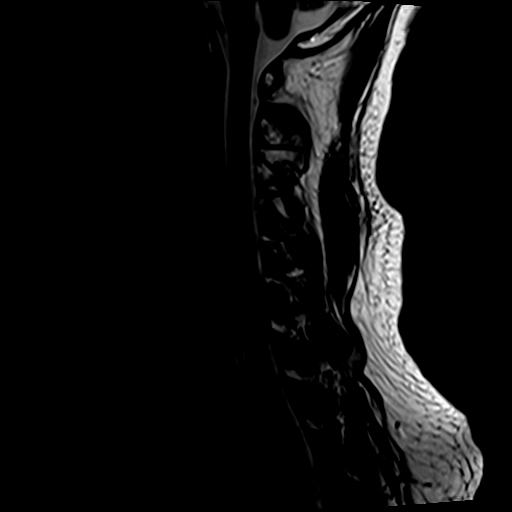
[im 9/12]
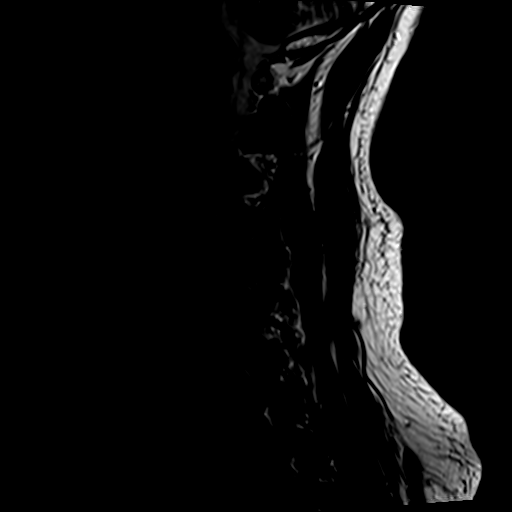
[im 12/12]
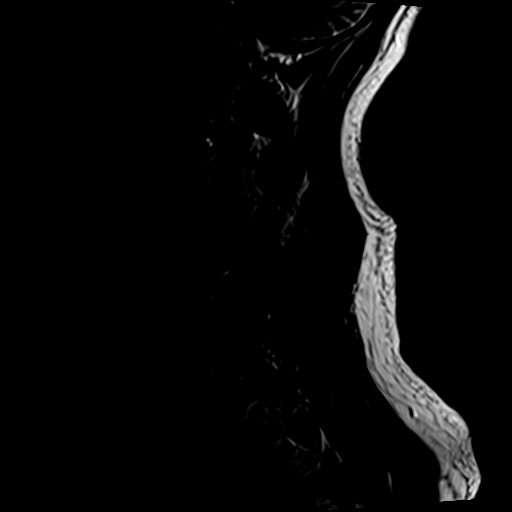

[Series 4: T1 · sagittal · 3.3mm · 0.39mm/px · 7 of 12 slices shown]
[im 1/12]
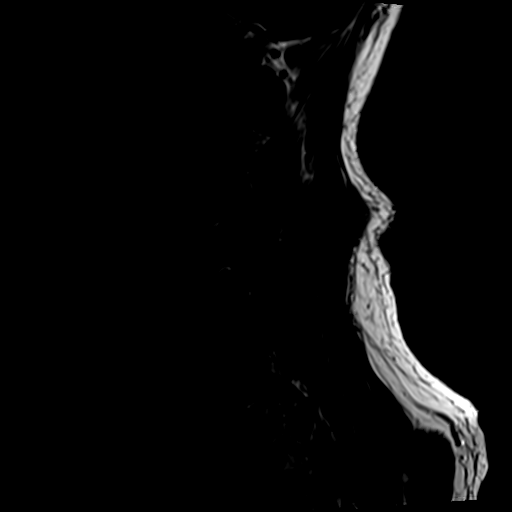
[im 2/12]
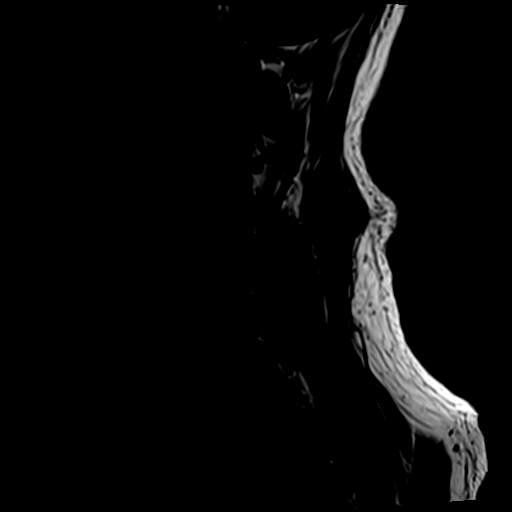
[im 4/12]
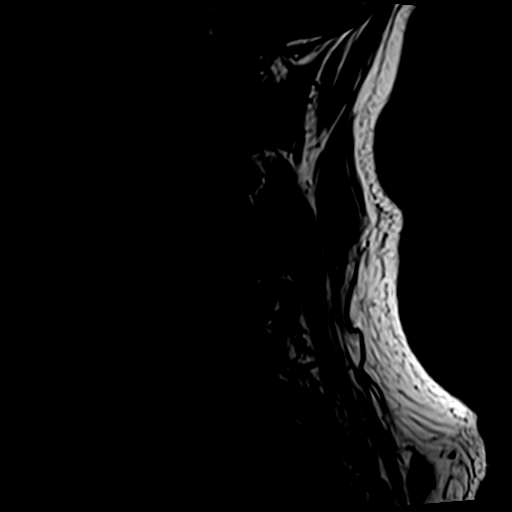
[im 6/12]
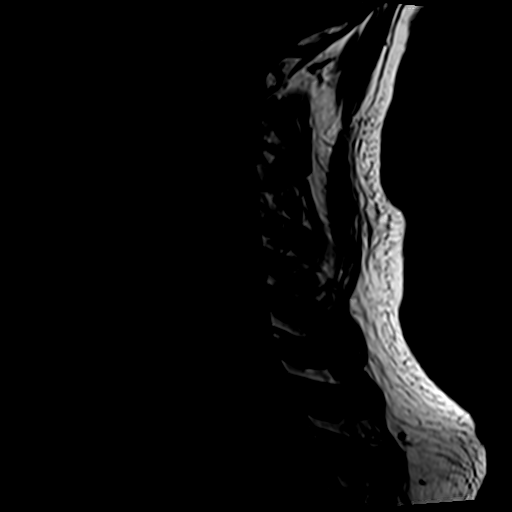
[im 8/12]
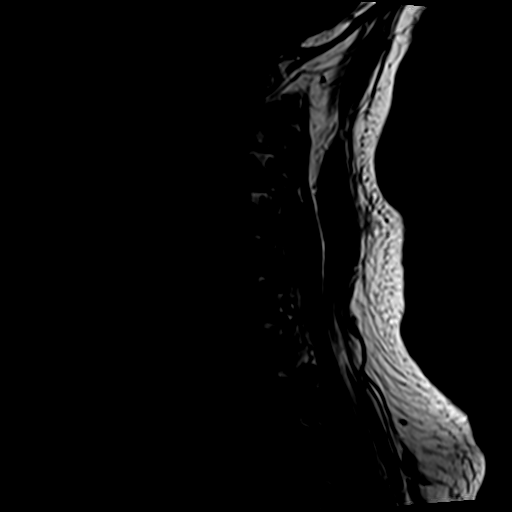
[im 10/12]
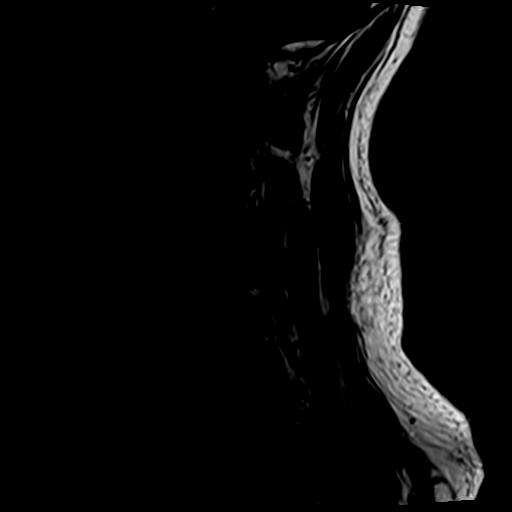
[im 12/12]
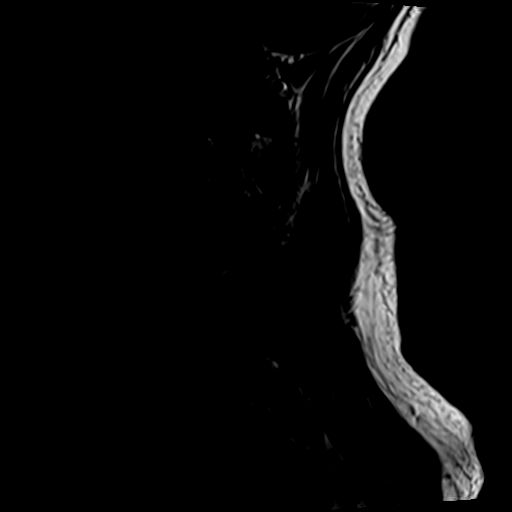

[Series 7: T2 · axial · 3.0mm · 0.70mm/px · z∈[-107,-17]mm · 8 of 26 slices shown (2 of 2)]
[im 1/26]
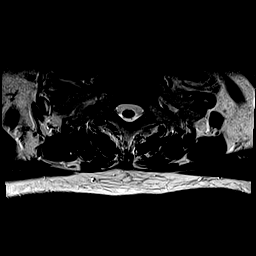
[im 4/26]
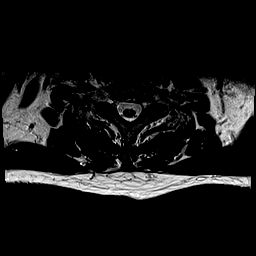
[im 8/26]
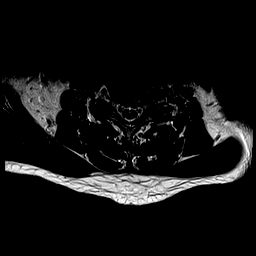
[im 12/26]
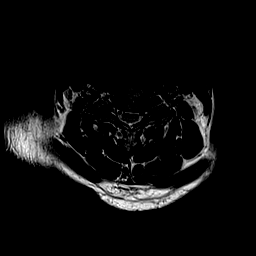
[im 14/26]
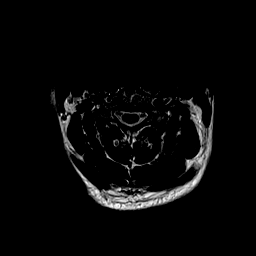
[im 18/26]
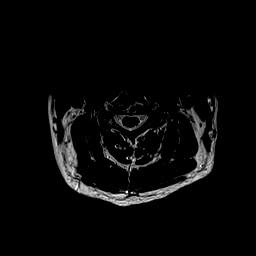
[im 22/26]
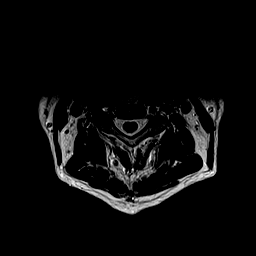
[im 26/26]
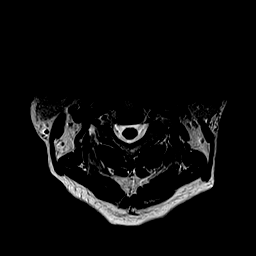

[Series 8: STIR · sagittal · 3.3mm · 0.52mm/px · 7 of 12 slices shown]
[im 1/12]
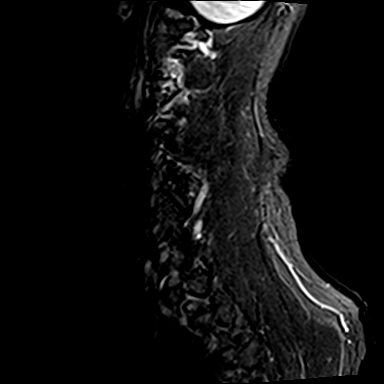
[im 2/12]
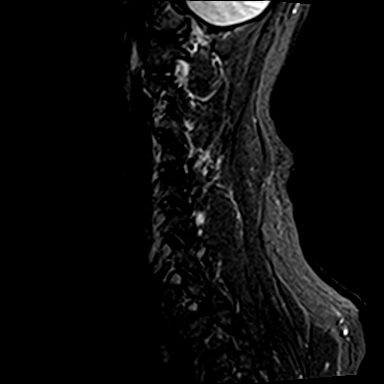
[im 4/12]
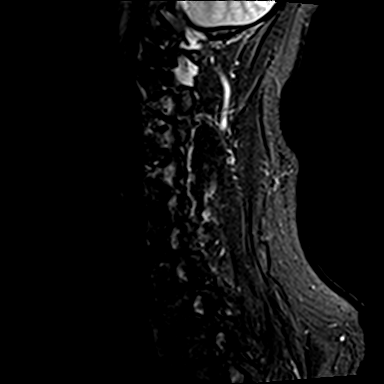
[im 6/12]
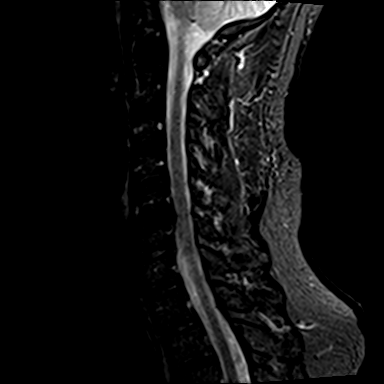
[im 8/12]
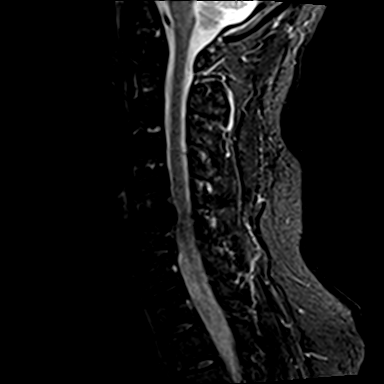
[im 10/12]
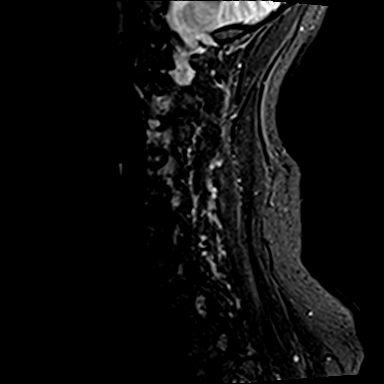
[im 12/12]
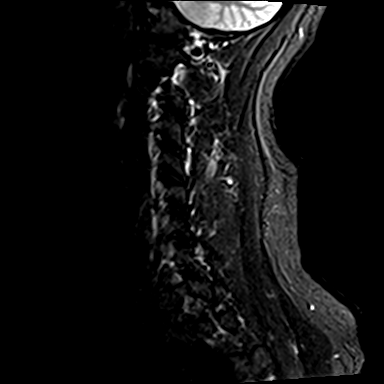

[28 of 48 positions shown; findings below may reference images not displayed]

FINDINGS: Alignment: Physiologic.

Vertebrae: No fracture, evidence of discitis, or bone lesion.

Cord: Normal signal and morphology.

Posterior Fossa, vertebral arteries, paraspinal tissues: Negative.

Disc levels:

Discs: Degenerative disc disease with disc height loss at C5-6 with
reactive marrow changes.

C2-3: No significant disc bulge. No neural foraminal stenosis. No
central canal stenosis.

C3-4: No significant disc bulge. No neural foraminal stenosis. No
central canal stenosis. Bilateral mild facet arthropathy.

C4-5: Tiny central disc protrusion. No neural foraminal stenosis. No
central canal stenosis.

C5-6: Broad-based disc bulge. Bilateral mild foraminal stenosis. No
central canal stenosis.

C6-7: Mild broad-based disc bulge. No neural foraminal stenosis. No
central canal stenosis.

C7-T1: No significant disc bulge. No neural foraminal stenosis. No
central canal stenosis.
IMPRESSION: 1. Degenerative disc disease with disc height loss at C5-6.
Broad-based disc bulge at C5-6 with mild bilateral foraminal
stenosis.
2. At C4-5 there is a tiny central disc protrusion.

## 2018-01-01 ENCOUNTER — Emergency Department (HOSPITAL_COMMUNITY)
Admission: EM | Admit: 2018-01-01 | Discharge: 2018-01-02 | Disposition: A | Discharge status: Home / Self Care | Payer: BLUE CROSS/BLUE SHIELD | Attending: Emergency Medicine | Admitting: Emergency Medicine

## 2018-01-01 ENCOUNTER — Encounter (HOSPITAL_COMMUNITY)
Admission: EM | Disposition: A | Discharge status: Home / Self Care | Payer: Self-pay | Source: Home / Self Care | Attending: Emergency Medicine

## 2018-01-01 ENCOUNTER — Emergency Department (HOSPITAL_COMMUNITY): Payer: BLUE CROSS/BLUE SHIELD | Admitting: Registered Nurse

## 2018-01-01 ENCOUNTER — Emergency Department (HOSPITAL_COMMUNITY): Payer: BLUE CROSS/BLUE SHIELD

## 2018-01-01 ENCOUNTER — Encounter (HOSPITAL_COMMUNITY): Payer: Self-pay | Admitting: Emergency Medicine

## 2018-01-01 DIAGNOSIS — N201 Calculus of ureter: Secondary | ICD-10-CM | POA: Diagnosis not present

## 2018-01-01 DIAGNOSIS — N179 Acute kidney failure, unspecified: Secondary | ICD-10-CM

## 2018-01-01 DIAGNOSIS — R109 Unspecified abdominal pain: Secondary | ICD-10-CM | POA: Diagnosis present

## 2018-01-01 DIAGNOSIS — N1330 Unspecified hydronephrosis: Secondary | ICD-10-CM | POA: Diagnosis not present

## 2018-01-01 DIAGNOSIS — I10 Essential (primary) hypertension: Secondary | ICD-10-CM | POA: Diagnosis not present

## 2018-01-01 DIAGNOSIS — Z79899 Other long term (current) drug therapy: Secondary | ICD-10-CM | POA: Diagnosis not present

## 2018-01-01 DIAGNOSIS — Z87442 Personal history of urinary calculi: Secondary | ICD-10-CM | POA: Insufficient documentation

## 2018-01-01 HISTORY — DX: Essential (primary) hypertension: I10

## 2018-01-01 HISTORY — DX: Calculus of kidney: N20.0

## 2018-01-01 HISTORY — PX: CYSTOSCOPY W/ URETERAL STENT PLACEMENT: SHX1429

## 2018-01-01 LAB — URINALYSIS, ROUTINE W REFLEX MICROSCOPIC
Bilirubin Urine: NEGATIVE
Glucose, UA: NEGATIVE mg/dL
Ketones, ur: 20 mg/dL — AB
Leukocytes, UA: NEGATIVE
Nitrite: NEGATIVE
PH: 5 (ref 5.0–8.0)
Protein, ur: 30 mg/dL — AB
SPECIFIC GRAVITY, URINE: 1.016 (ref 1.005–1.030)

## 2018-01-01 LAB — COMPREHENSIVE METABOLIC PANEL
ALBUMIN: 4.7 g/dL (ref 3.5–5.0)
ALT: 22 U/L (ref 0–44)
ANION GAP: 14 (ref 5–15)
AST: 22 U/L (ref 15–41)
Alkaline Phosphatase: 47 U/L (ref 38–126)
BILIRUBIN TOTAL: 1.6 mg/dL — AB (ref 0.3–1.2)
BUN: 18 mg/dL (ref 8–23)
CALCIUM: 9.7 mg/dL (ref 8.9–10.3)
CHLORIDE: 103 mmol/L (ref 98–111)
CO2: 24 mmol/L (ref 22–32)
CREATININE: 1.69 mg/dL — AB (ref 0.61–1.24)
GFR calc Af Amer: 48 mL/min — ABNORMAL LOW (ref >60)
GFR, EST NON AFRICAN AMERICAN: 41 mL/min — AB (ref >60)
Glucose, Bld: 138 mg/dL — ABNORMAL HIGH (ref 70–99)
Potassium: 3.9 mmol/L (ref 3.5–5.1)
Sodium: 141 mmol/L (ref 135–145)
Total Protein: 7.7 g/dL (ref 6.5–8.1)

## 2018-01-01 LAB — CBC
HCT: 49.6 % (ref 39.0–52.0)
Hemoglobin: 15.9 g/dL (ref 13.0–17.0)
MCH: 29.2 pg (ref 26.0–34.0)
MCHC: 32.1 g/dL (ref 30.0–36.0)
MCV: 91.2 fL (ref 78.0–100.0)
PLATELETS: 195 10*3/uL (ref 150–400)
RBC: 5.44 MIL/uL (ref 4.22–5.81)
RDW: 13.3 % (ref 11.5–15.5)
WBC: 9.4 10*3/uL (ref 4.0–10.5)

## 2018-01-01 LAB — LIPASE, BLOOD: LIPASE: 33 U/L (ref 11–51)

## 2018-01-01 SURGERY — CYSTOSCOPY, WITH RETROGRADE PYELOGRAM AND URETERAL STENT INSERTION
Anesthesia: General | Site: Ureter | Laterality: Bilateral

## 2018-01-01 MED ORDER — PROPOFOL 10 MG/ML IV BOLUS
INTRAVENOUS | Status: AC
  Filled 2018-01-01: qty 20

## 2018-01-01 MED ORDER — MEPERIDINE HCL 50 MG/ML IJ SOLN: 6.2500 mg | INTRAMUSCULAR | Status: DC | PRN

## 2018-01-01 MED ORDER — CEFAZOLIN SODIUM-DEXTROSE 2-4 GM/100ML-% IV SOLN
INTRAVENOUS | Status: AC
  Filled 2018-01-01: qty 100

## 2018-01-01 MED ORDER — SODIUM CHLORIDE 0.9 % IR SOLN
Status: DC | PRN
  Administered 2018-01-01: 3000 mL

## 2018-01-01 MED ORDER — HYDROMORPHONE HCL 1 MG/ML IJ SOLN: 0.2500 mg | INTRAMUSCULAR | Status: DC | PRN

## 2018-01-01 MED ORDER — LIDOCAINE 2% (20 MG/ML) 5 ML SYRINGE
INTRAMUSCULAR | Status: AC
  Filled 2018-01-01: qty 5

## 2018-01-01 MED ORDER — DEXAMETHASONE SODIUM PHOSPHATE 10 MG/ML IJ SOLN
INTRAMUSCULAR | Status: DC | PRN
  Administered 2018-01-01: 10 mg via INTRAVENOUS

## 2018-01-01 MED ORDER — ONDANSETRON HCL 4 MG/2ML IJ SOLN
INTRAMUSCULAR | Status: DC | PRN
  Administered 2018-01-01: 4 mg via INTRAVENOUS

## 2018-01-01 MED ORDER — DEXAMETHASONE SODIUM PHOSPHATE 10 MG/ML IJ SOLN
INTRAMUSCULAR | Status: AC
  Filled 2018-01-01: qty 1

## 2018-01-01 MED ORDER — FENTANYL CITRATE (PF) 100 MCG/2ML IJ SOLN
INTRAMUSCULAR | Status: AC
  Filled 2018-01-01: qty 2

## 2018-01-01 MED ORDER — ONDANSETRON 4 MG PO TBDP
4.0000 mg | ORAL_TABLET | Freq: Once | ORAL | Status: AC | PRN
  Administered 2018-01-01: 4 mg via ORAL
  Filled 2018-01-01: qty 1

## 2018-01-01 MED ORDER — ONDANSETRON HCL 4 MG/2ML IJ SOLN
INTRAMUSCULAR | Status: AC
  Filled 2018-01-01: qty 2

## 2018-01-01 MED ORDER — MIDAZOLAM HCL 2 MG/2ML IJ SOLN
INTRAMUSCULAR | Status: AC
  Filled 2018-01-01: qty 2

## 2018-01-01 MED ORDER — SODIUM CHLORIDE 0.9 % IV SOLN
INTRAVENOUS | Status: DC | PRN
  Administered 2018-01-01: 23:00:00 via INTRAVENOUS

## 2018-01-01 MED ORDER — SUCCINYLCHOLINE CHLORIDE 200 MG/10ML IV SOSY
PREFILLED_SYRINGE | INTRAVENOUS | Status: DC | PRN
  Administered 2018-01-01: 120 mg via INTRAVENOUS

## 2018-01-01 MED ORDER — SUCCINYLCHOLINE CHLORIDE 200 MG/10ML IV SOSY
PREFILLED_SYRINGE | INTRAVENOUS | Status: AC
  Filled 2018-01-01: qty 10

## 2018-01-01 MED ORDER — FENTANYL CITRATE (PF) 250 MCG/5ML IJ SOLN
INTRAMUSCULAR | Status: DC | PRN
  Administered 2018-01-01: 100 ug via INTRAVENOUS

## 2018-01-01 MED ORDER — OXYCODONE-ACETAMINOPHEN 5-325 MG PO TABS
1.0000 | ORAL_TABLET | ORAL | Status: AC | PRN
  Administered 2018-01-01 (×2): 1 via ORAL
  Filled 2018-01-01 (×2): qty 1

## 2018-01-01 MED ORDER — PROPOFOL 10 MG/ML IV BOLUS
INTRAVENOUS | Status: DC | PRN
  Administered 2018-01-01: 130 mg via INTRAVENOUS

## 2018-01-01 MED ORDER — 0.9 % SODIUM CHLORIDE (POUR BTL) OPTIME
TOPICAL | Status: DC | PRN
  Administered 2018-01-01: 1000 mL

## 2018-01-01 MED ORDER — LIDOCAINE 2% (20 MG/ML) 5 ML SYRINGE
INTRAMUSCULAR | Status: DC | PRN
  Administered 2018-01-01: 100 mg via INTRAVENOUS

## 2018-01-01 MED ORDER — TAMSULOSIN HCL 0.4 MG PO CAPS: 0.4000 mg | ORAL_CAPSULE | Freq: Every day | ORAL | 0 refills | Status: DC

## 2018-01-01 MED ORDER — OXYCODONE-ACETAMINOPHEN 5-325 MG PO TABS: 1.0000 | ORAL_TABLET | ORAL | 0 refills | Status: DC | PRN

## 2018-01-01 MED ORDER — MIDAZOLAM HCL 5 MG/5ML IJ SOLN
INTRAMUSCULAR | Status: DC | PRN
  Administered 2018-01-01: 2 mg via INTRAVENOUS

## 2018-01-01 MED ORDER — IOHEXOL 300 MG/ML  SOLN
INTRAMUSCULAR | Status: DC | PRN
  Administered 2018-01-01: 10 mL

## 2018-01-01 MED ORDER — CEFAZOLIN SODIUM-DEXTROSE 2-4 GM/100ML-% IV SOLN
2.0000 g | Freq: Three times a day (TID) | INTRAVENOUS | Status: DC
  Administered 2018-01-01: 2 g via INTRAVENOUS

## 2018-01-01 MED ORDER — ONDANSETRON HCL 4 MG/2ML IJ SOLN
4.0000 mg | Freq: Once | INTRAMUSCULAR | Status: DC | PRN
Start: 2018-01-01 — End: 2018-01-02

## 2018-01-01 SURGICAL SUPPLY — 19 items
BAG URO CATCHER STRL LF (MISCELLANEOUS) ×2 IMPLANT
CATH INTERMIT  6FR 70CM (CATHETERS) ×2 IMPLANT
CLOTH BEACON ORANGE TIMEOUT ST (SAFETY) ×2 IMPLANT
EXTRACTOR STONE NITINOL NGAGE (UROLOGICAL SUPPLIES) IMPLANT
FIBER LASER TRAC TIP (UROLOGICAL SUPPLIES) IMPLANT
GLOVE BIO SURGEON STRL SZ8 (GLOVE) ×6 IMPLANT
GOWN STRL REUS W/TWL XL LVL3 (GOWN DISPOSABLE) ×4 IMPLANT
GUIDEWIRE ANG ZIPWIRE 038X150 (WIRE) IMPLANT
GUIDEWIRE STR DUAL SENSOR (WIRE) ×2 IMPLANT
IV NS 1000ML (IV SOLUTION)
IV NS 1000ML BAXH (IV SOLUTION) IMPLANT
MANIFOLD NEPTUNE II (INSTRUMENTS) ×2 IMPLANT
PACK CYSTO (CUSTOM PROCEDURE TRAY) ×2 IMPLANT
SHEATH URETERAL 12FRX35CM (MISCELLANEOUS) IMPLANT
STENT CONTOUR 6FRX26X.038 (STENTS) IMPLANT
STENT URET 6FRX24 CONTOUR (STENTS) ×2 IMPLANT
STENT URET 6FRX26 CONTOUR (STENTS) ×2 IMPLANT
TUBE FEEDING 8FR 16IN STR KANG (MISCELLANEOUS) IMPLANT
TUBING CONNECTING 10 (TUBING) ×2 IMPLANT

## 2018-01-01 NOTE — Anesthesia Postprocedure Evaluation (Signed)
Anesthesia Post Note  Patient: George Bishop  Procedure(s) Performed: CYSTOSCOPY WITH RETROGRADE PYELOGRAM/URETERAL STENT PLACEMENT (Bilateral Ureter)     Patient location during evaluation: PACU Anesthesia Type: General Level of consciousness: awake and alert Pain management: pain level controlled Vital Signs Assessment: post-procedure vital signs reviewed and stable Respiratory status: spontaneous breathing, nonlabored ventilation, respiratory function stable and patient connected to nasal cannula oxygen Cardiovascular status: blood pressure returned to baseline and stable Postop Assessment: no apparent nausea or vomiting Anesthetic complications: no    Last Vitals:  Vitals:   01/01/18 2130 01/01/18 2230  BP: (!) 142/76 (!) 143/79  Pulse: 64 78  Resp: 15 18  Temp:    SpO2: 100%     Last Pain:  Vitals:   01/01/18 2231  TempSrc:   PainSc: 8                  Manasa Spease DAVID

## 2018-01-01 NOTE — ED Notes (Signed)
Urine was dark & cloudy so I sent a urine culture with specimen

## 2018-01-01 NOTE — ED Notes (Signed)
Pt unable to sign as they were being loaded up during report to other [facility] rep.

## 2018-01-01 NOTE — Discharge Instructions (Signed)

## 2018-01-01 NOTE — ED Triage Notes (Signed)
Patient to ED c/o LLQ abdominal pain radiating around flank to lower back x 3-4 hours with N/V. Reports history of kidney stones requiring surgery but states this feels different. Denies fevers/chills, no urinary symptoms.

## 2018-01-01 NOTE — ED Provider Notes (Signed)
Pt stable on arrival to ER. No complaints at this time. Will be taken to the OR at this time. Urology aware   Azalia Bilisampos, Theresea Trautmann, MD 01/01/18 2234

## 2018-01-01 NOTE — Transfer of Care (Signed)
Immediate Anesthesia Transfer of Care Note  Patient: George Bishop  Procedure(s) Performed: CYSTOSCOPY WITH RETROGRADE PYELOGRAM/URETERAL STENT PLACEMENT (Bilateral Ureter)  Patient Location: PACU  Anesthesia Type:General  Level of Consciousness: sedated  Airway & Oxygen Therapy: Patient Spontanous Breathing and Patient connected to face mask oxygen  Post-op Assessment: Report given to RN and Post -op Vital signs reviewed and stable  Post vital signs: Reviewed and stable  Last Vitals:  Vitals Value Taken Time  BP 132/89 01/01/2018 11:53 PM  Temp    Pulse 108 01/01/2018 11:53 PM  Resp 16 01/01/2018 11:53 PM  SpO2 99 % 01/01/2018 11:53 PM  Vitals shown include unvalidated device data.  Last Pain:  Vitals:   01/01/18 2231  TempSrc:   PainSc: 8          Complications: No apparent anesthesia complications

## 2018-01-01 NOTE — Op Note (Signed)
Preoperative diagnosis: bilateral ureteral calculi  Postoperative diagnosis: Same  Procedure: 1 cystoscopy 2. bilateralretrograde pyelography 3.  Intraoperative fluoroscopy, under one hour, with interpretation 4.  bilateral 6 x 26 JJ stent exchange  Attending: Cleda MccreedyPatrick Mackenzie  Anesthesia: General  Estimated blood loss: None  Drains: right 6 x 26 JJ ureteral stent without tether and left 6 x 24 JJ ureteral stent without tether  Specimens: none  Antibiotics: ancef  Findings: proximal bilateral ureteral stones. Mild bilateral hydronephrosis. No masses/lesions in the bladder. Ureteral orifices in normal anatomic location.  Indications: Patient is a 62 year old male with a history of bilateral ureteral stones and elevated creatinine. After discussing treatment options, they decided proceed with bilateral stent placement.  Procedure her in detail: The patient was brought to the operating room and a brief timeout was done to ensure correct patient, correct procedure, correct site.  General anesthesia was administered patient was placed in dorsal lithotomy position.  Her genitalia was then prepped and draped in usual sterile fashion.  A rigid 22 French cystoscope was passed in the urethra and the bladder.  Bladder was inspected free masses or lesions.  the ureteral orifices were in the normal orthotopic locations. a 6 french ureteral catheter was then instilled into the left ureteral orifice.  a gentle retrograde was obtained and findings noted above. We then advanced a zipwire up to the renal pelvis. We then placed a 6 x 24 double-j ureteral stent over the zip wire. We then removed the wire and good coil was noted in the the renal pelvis under fluoroscopy and the bladder under direct vision.  We then turned out attention to the right side.  a gentle retrograde was obtained and findings noted above. We then advanced a zipwire up to the renal pelvis. we then placed a 6 x 26 double-j ureteral stent  over the original zip wire.  We then removed the wire and good coil was noted in the the renal pelvis under fluoroscopy and the bladder under direct vision.  the bladder was then drained and this concluded the procedure which was well tolerated by patient.  Complications: None  Condition: Stable, extubated, transferred to PACU  Plan: Patient is to be discharged home as to follow-up in 2 weeks for stone extraction.

## 2018-01-01 NOTE — ED Notes (Signed)
[facility] called for transport to [place] 

## 2018-01-01 NOTE — Anesthesia Preprocedure Evaluation (Signed)
Anesthesia Evaluation  Patient identified by MRN, date of birth, ID band Patient awake    Reviewed: Allergy & Precautions, NPO status , Patient's Chart, lab work & pertinent test results  Airway Mallampati: I  TM Distance: >3 FB Neck ROM: Full    Dental   Pulmonary    Pulmonary exam normal        Cardiovascular hypertension, Pt. on medications Normal cardiovascular exam     Neuro/Psych    GI/Hepatic   Endo/Other    Renal/GU      Musculoskeletal   Abdominal   Peds  Hematology   Anesthesia Other Findings   Reproductive/Obstetrics                             Anesthesia Physical Anesthesia Plan  ASA: II and emergent  Anesthesia Plan: General   Post-op Pain Management:    Induction: Intravenous  PONV Risk Score and Plan: 2 and Ondansetron, Midazolam and Treatment may vary due to age or medical condition  Airway Management Planned: Oral ETT  Additional Equipment:   Intra-op Plan:   Post-operative Plan: Extubation in OR  Informed Consent: I have reviewed the patients History and Physical, chart, labs and discussed the procedure including the risks, benefits and alternatives for the proposed anesthesia with the patient or authorized representative who has indicated his/her understanding and acceptance.     Plan Discussed with: CRNA and Surgeon  Anesthesia Plan Comments:         Anesthesia Quick Evaluation

## 2018-01-01 NOTE — ED Notes (Signed)
Cleaned Pt with CHG wipes

## 2018-01-01 NOTE — Anesthesia Procedure Notes (Signed)
Procedure Name: Intubation Date/Time: 01/01/2018 11:24 PM Performed by: Talbot Grumbling, CRNA Pre-anesthesia Checklist: Patient identified, Emergency Drugs available, Suction available and Patient being monitored Patient Re-evaluated:Patient Re-evaluated prior to induction Preoxygenation: Pre-oxygenation with 100% oxygen Induction Type: IV induction Ventilation: Mask ventilation without difficulty Laryngoscope Size: Glidescope Grade View: Grade I Tube type: Oral Tube size: 7.5 mm Number of attempts: 2 (first attempt with Mac 3. Grade 3 unsuccessful intubation. Noted immediately, tube withdrawn and placed with ease using glidescope) Airway Equipment and Method: Stylet and Video-laryngoscopy Placement Confirmation: ETT inserted through vocal cords under direct vision,  positive ETCO2 and breath sounds checked- equal and bilateral Secured at: 21 cm Tube secured with: Tape

## 2018-01-01 NOTE — H&P (Signed)
Urology Admission H&P  Chief Complaint: right flank pain  History of Present Illness: Mr George Bishop is a 62yo with a hx of nephrolithiasis who presented to the ER with a 2 day hx of severe sharp, constant nonradiating right flank pain. He had associated nausea and vomiting. No fevers. No LUTS. His last stone event was in 2007. CT scan showed bilateral proximal ureteral calculi. Creatinien 1.69  Past Medical History:  Diagnosis Date  . Hypertension   . Nephrolithiasis    Past Surgical History:  Procedure Laterality Date  . URETEROSCOPY  2007    Home Medications:  Current Facility-Administered Medications  Medication Dose Route Frequency Provider Last Rate Last Dose  . 0.9 % irrigation (POUR BTL)    PRN Raylinn Kosar L, MD   1,000 mL at 01/01/18 2304  . ceFAZolin (ANCEF) 2-4 GM/100ML-% IVPB           . sodium chloride irrigation 0.9 %    PRN Albert Hersch L, MD   3,000 mL at 01/01/18 2259   Current Outpatient Medications  Medication Sig Dispense Refill Last Dose  . azithromycin (ZITHROMAX) 250 MG tablet Take 2 tabs PO x 1 dose, then 1 tab PO QD x 4 days (Patient not taking: Reported on 01/01/2018) 6 tablet 0 Completed Course at Unknown time  . benzonatate (TESSALON) 200 MG capsule Take 1 capsule (200 mg total) by mouth 2 (two) times daily as needed for cough. (Patient not taking: Reported on 01/01/2018) 20 capsule 0 Completed Course at Unknown time  . cetirizine (ZYRTEC) 10 MG tablet Take 1 tablet (10 mg total) by mouth daily. (Patient not taking: Reported on 01/01/2018) 30 tablet 11 Completed Course at Unknown time  . cyclobenzaprine (FLEXERIL) 5 MG tablet Take 1-2 tablets (5-10 mg total) by mouth 3 (three) times daily as needed for muscle spasms. (Patient not taking: Reported on 09/22/2017) 30 tablet 3 Completed Course at Unknown time  . fluticasone (FLONASE) 50 MCG/ACT nasal spray Place 2 sprays into both nostrils daily. (Patient not taking: Reported on 01/01/2018) 16 g 6 Completed Course  at Unknown time  . HYDROcodone-homatropine (HYCODAN) 5-1.5 MG/5ML syrup Take 5 mLs by mouth every 8 (eight) hours as needed for cough. (Patient not taking: Reported on 01/01/2018) 120 mL 0 Completed Course at Unknown time  . meloxicam (MOBIC) 7.5 MG tablet TAKE 1 TABLET (7.5 MG TOTAL) BY MOUTH 2 (TWO) TIMES DAILY AS NEEDED FOR PAIN. (Patient not taking: Reported on 09/22/2017) 30 tablet 2 Completed Course at Unknown time   Allergies: No Known Allergies  History reviewed. No pertinent family history. Social History:  reports that he has never smoked. He has never used smokeless tobacco. He reports that he does not drink alcohol or use drugs.  Review of Systems  Gastrointestinal: Positive for nausea and vomiting.  Genitourinary: Positive for flank pain.  All other systems reviewed and are negative.   Physical Exam:  Vital signs in last 24 hours: Temp:  [97.6 F (36.4 C)] 97.6 F (36.4 C) (08/29 1719) Pulse Rate:  [64-78] 78 (08/29 2230) Resp:  [15-21] 18 (08/29 2230) BP: (135-162)/(76-99) 143/79 (08/29 2230) SpO2:  [94 %-100 %] 100 % (08/29 2130) Physical Exam  Constitutional: He is oriented to person, place, and time. He appears well-developed and well-nourished.  HENT:  Head: Normocephalic and atraumatic.  Eyes: Pupils are equal, round, and reactive to light. EOM are normal.  Neck: Normal range of motion. No thyromegaly present.  Cardiovascular: Normal rate and regular rhythm.  Respiratory: Effort   normal. No respiratory distress.  GI: Soft. He exhibits no distension.  Musculoskeletal: Normal range of motion. He exhibits no edema.  Neurological: He is alert and oriented to person, place, and time.  Skin: Skin is warm and dry.  Psychiatric: He has a normal mood and affect. His behavior is normal. Judgment and thought content normal.    Laboratory Data:  Results for orders placed or performed during the hospital encounter of 01/01/18 (from the past 24 hour(s))  Lipase, blood      Status: None   Collection Time: 01/01/18  5:45 PM  Result Value Ref Range   Lipase 33 11 - 51 U/L  Comprehensive metabolic panel     Status: Abnormal   Collection Time: 01/01/18  5:45 PM  Result Value Ref Range   Sodium 141 135 - 145 mmol/L   Potassium 3.9 3.5 - 5.1 mmol/L   Chloride 103 98 - 111 mmol/L   CO2 24 22 - 32 mmol/L   Glucose, Bld 138 (H) 70 - 99 mg/dL   BUN 18 8 - 23 mg/dL   Creatinine, Ser 1.69 (H) 0.61 - 1.24 mg/dL   Calcium 9.7 8.9 - 10.3 mg/dL   Total Protein 7.7 6.5 - 8.1 g/dL   Albumin 4.7 3.5 - 5.0 g/dL   AST 22 15 - 41 U/L   ALT 22 0 - 44 U/L   Alkaline Phosphatase 47 38 - 126 U/L   Total Bilirubin 1.6 (H) 0.3 - 1.2 mg/dL   GFR calc non Af Amer 41 (L) >60 mL/min   GFR calc Af Amer 48 (L) >60 mL/min   Anion gap 14 5 - 15  CBC     Status: None   Collection Time: 01/01/18  5:45 PM  Result Value Ref Range   WBC 9.4 4.0 - 10.5 K/uL   RBC 5.44 4.22 - 5.81 MIL/uL   Hemoglobin 15.9 13.0 - 17.0 g/dL   HCT 49.6 39.0 - 52.0 %   MCV 91.2 78.0 - 100.0 fL   MCH 29.2 26.0 - 34.0 pg   MCHC 32.1 30.0 - 36.0 g/dL   RDW 13.3 11.5 - 15.5 %   Platelets 195 150 - 400 K/uL  Urinalysis, Routine w reflex microscopic     Status: Abnormal   Collection Time: 01/01/18  6:19 PM  Result Value Ref Range   Color, Urine YELLOW YELLOW   APPearance CLOUDY (A) CLEAR   Specific Gravity, Urine 1.016 1.005 - 1.030   pH 5.0 5.0 - 8.0   Glucose, UA NEGATIVE NEGATIVE mg/dL   Hgb urine dipstick LARGE (A) NEGATIVE   Bilirubin Urine NEGATIVE NEGATIVE   Ketones, ur 20 (A) NEGATIVE mg/dL   Protein, ur 30 (A) NEGATIVE mg/dL   Nitrite NEGATIVE NEGATIVE   Leukocytes, UA NEGATIVE NEGATIVE   RBC / HPF 21-50 0 - 5 RBC/hpf   WBC, UA 0-5 0 - 5 WBC/hpf   Bacteria, UA RARE (A) NONE SEEN   Squamous Epithelial / LPF 0-5 0 - 5   Mucus PRESENT    Ca Oxalate Crys, UA PRESENT    No results found for this or any previous visit (from the past 240 hour(s)). Creatinine: Recent Labs    01/01/18 1745   CREATININE 1.69*   Baseline Creatinine: unknown  Impression/Assessment:  62yo with bilateral ureteral calculi  Plan:  The risks/benefits/alternatives to bilateral ureteral stent placement was explained to the patient and he understands and wishes to proceed with surgery  Korin Hartwell 01/01/2018, 11:10 PM      

## 2018-01-01 NOTE — ED Notes (Signed)
Pt transported to OR. Pt's belongings given to wife

## 2018-01-01 NOTE — ED Notes (Signed)
Pt belongings include: one pair of shorts

## 2018-01-01 NOTE — ED Provider Notes (Signed)
[facility] EMERGENCY DEPARTMENT Provider Note   CSN: [id number] Arrival date & time: 01/01/18  1649     History   Chief Complaint Chief Complaint  Patient presents with  . Abdominal Pain    HPI George Bishop is a 62 y.o. male.  Patients with history of kidney stones, surgery for a stone at [facility] in 2007, no other abdominal surgeries, Burmese speaking, interpreter used for history --presents with acute onset of left lower quadrant pain with radiation to the flank starting late this morning.  No associated fevers, nausea, vomiting, diarrhea.  No urinary symptoms reported.  Patient took a medication for pain and a medication that he placed under his tongue prior to arrival.  Pain sent feels similar to previous kidney stones.  The onset of this condition was acute. The course is improved. Aggravating factors: none. Alleviating factors: none.       Past Medical History:  Diagnosis Date  . Hypertension   . Nephrolithiasis     Patient Active Problem List   Diagnosis Date Noted  . Cervical radiculopathy 06/03/2016    Past Surgical History:  Procedure Laterality Date  . URETEROSCOPY  2007        Home Medications    Prior to Admission medications   Medication Sig Start Date End Date Taking? Authorizing Provider  azithromycin (ZITHROMAX) 250 MG tablet Take 2 tabs PO x 1 dose, then 1 tab PO QD x 4 days 09/26/17   McVey, Madelaine Bhat, PA-C  benzonatate (TESSALON) 200 MG capsule Take 1 capsule (200 mg total) by mouth 2 (two) times daily as needed for cough. 09/26/17   McVey, Madelaine Bhat, PA-C  cetirizine (ZYRTEC) 10 MG tablet Take 1 tablet (10 mg total) by mouth daily. 09/22/17   Doristine Bosworth, MD  cyclobenzaprine (FLEXERIL) 5 MG tablet Take 1-2 tablets (5-10 mg total) by mouth 3 (three) times daily as needed for muscle spasms. Patient not taking: Reported on 09/22/2017 06/03/16   Tarry Kos, MD  fluticasone [facility]) 50 MCG/ACT nasal spray  Place 2 sprays into both nostrils daily. 09/22/17   Doristine Bosworth, MD  HYDROcodone-homatropine (HYCODAN) 5-1.5 MG/5ML syrup Take 5 mLs by mouth every 8 (eight) hours as needed for cough. 09/26/17   McVey, Madelaine Bhat, PA-C  meloxicam (MOBIC) 7.5 MG tablet TAKE 1 TABLET (7.5 MG TOTAL) BY MOUTH 2 (TWO) TIMES DAILY AS NEEDED FOR PAIN. Patient not taking: Reported on 09/22/2017 08/08/16   Tarry Kos, MD    Family History No family history on file.  Social History Social History   Tobacco Use  . Smoking status: Never Smoker  . Smokeless tobacco: Never Used  Substance Use Topics  . Alcohol use: No  . Drug use: No     Allergies   Patient has no known allergies.   Review of Systems Review of Systems  Constitutional: Negative for fever.  HENT: Negative for rhinorrhea and sore throat.   Eyes: Negative for redness.  Respiratory: Negative for cough.   Cardiovascular: Negative for chest pain.  Gastrointestinal: Positive for abdominal pain, nausea and vomiting. Negative for diarrhea.  Genitourinary: Positive for flank pain. Negative for dysuria, hematuria and urgency.  Musculoskeletal: Negative for myalgias.  Skin: Negative for rash.  Neurological: Negative for headaches.     Physical Exam Updated Vital Signs BP (!) 162/98 (BP Location: Right Arm)   Pulse 65   Temp 97.6 F (36.4 C) (Oral)   Resp 16   SpO2 100%  Physical Exam  Constitutional: He appears well-developed and well-nourished.  HENT:  Head: Normocephalic and atraumatic.  Eyes: Conjunctivae are normal. Right eye exhibits no discharge. Left eye exhibits no discharge.  Neck: Normal range of motion. Neck supple.  Cardiovascular: Normal rate, regular rhythm and normal heart sounds.  Pulmonary/Chest: Effort normal and breath sounds normal.  Abdominal: Soft. He exhibits no pulsatile midline mass. There is tenderness (Mild) in the left lower quadrant. There is no rebound and no guarding.  Neurological: He is  alert.  Skin: Skin is warm and dry.  Psychiatric: He has a normal mood and affect.  Nursing note and vitals reviewed.    ED Treatments / Results  Labs (all labs ordered are listed, but only abnormal results are displayed) Labs Reviewed  COMPREHENSIVE METABOLIC PANEL - Abnormal; Notable for the following components:      Result Value   Glucose, Bld 138 (*)    Creatinine, Ser 1.69 (*)    Total Bilirubin 1.6 (*)    GFR calc non Af Amer 41 (*)    GFR calc Af Amer 48 (*)    All other components within normal limits  URINALYSIS, ROUTINE W REFLEX MICROSCOPIC - Abnormal; Notable for the following components:   APPearance CLOUDY (*)    Hgb urine dipstick LARGE (*)    Ketones, ur 20 (*)    Protein, ur 30 (*)    Bacteria, UA RARE (*)    All other components within normal limits  LIPASE, BLOOD  CBC    EKG None  Radiology Ct Renal Stone Study  Result Date: 01/01/2018 CLINICAL DATA:  62 year old male with acute LEFT abdominal and pelvic pain with nausea today. EXAM: CT ABDOMEN AND PELVIS WITHOUT CONTRAST TECHNIQUE: Multidetector CT imaging of the abdomen and pelvis was performed following the standard protocol without IV contrast. COMPARISON:  None. FINDINGS: Please note that parenchymal abnormalities may be missed without intravenous contrast. Lower chest: No acute abnormality. Hepatobiliary: Hepatic steatosis noted without focal hepatic abnormality. The gallbladder is unremarkable. No biliary dilatation. Pancreas: Unremarkable Spleen: Unremarkable Adrenals/Urinary Tract: A 5 mm proximal RIGHT ureteral calculus causes moderate to severe RIGHT hydronephrosis and a 3 mm proximal LEFT ureteral calculus causes mild LEFT hydronephrosis. Nonobstructing bilateral renal calculi are identified the largest measuring 7 mm in the RIGHT LOWER pole. The adrenal glands and bladder are unremarkable. Stomach/Bowel: Stomach is within normal limits. No evidence of bowel wall thickening, distention, or  inflammatory changes. Vascular/Lymphatic: Aortic atherosclerosis. No enlarged abdominal or pelvic lymph nodes. Reproductive: Mild prostate enlargement noted. Other: No ascites, focal collection or pneumoperitoneum. Musculoskeletal: No acute or suspicious bony abnormalities noted. IMPRESSION: 1. 5 mm proximal RIGHT ureteral calculus causing moderate to severe RIGHT hydronephrosis and 3 mm proximal LEFT ureteral calculus causing mild LEFT hydronephrosis. 2. Nonobstructing bilateral renal calculi 3. Hepatic steatosis 4.  Aortic Atherosclerosis (ICD10-I70.0). Electronically Signed   By: Harmon PierJeffrey  Hu M.D.   On: 01/01/2018 20:21    Procedures Procedures (including critical care time)  Medications Ordered in ED Medications  ondansetron (ZOFRAN-ODT) disintegrating tablet 4 mg (4 mg Oral Given 01/01/18 1739)  oxyCODONE-acetaminophen (PERCOCET/ROXICET) 5-325 MG per tablet 1 tablet (1 tablet Oral Given 01/01/18 2003)     Initial Impression / Assessment and Plan / ED Course  I have reviewed the triage vital signs and the nursing notes.  Pertinent labs & imaging results that were available during my care of the patient were reviewed by me and considered in my medical decision making (see chart for details).  Patient seen and examined. Work-up initiated.  Vital signs reviewed and are as follows: BP (!) 162/98 (BP Location: Right Arm)   Pulse 65   Temp 97.6 F (36.4 C) (Oral)   Resp 16   SpO2 100%   CT imaging reviewed by myself.  Patient with bilateral obstructing ureteral stones.  This is in setting of increased creatinine, unclear baseline, and reportedly decreased urinary output this morning.  I spoke with Dr. Ronne Binning of urology who has reviewed imaging.  He request that patient be transferred to [facility] emergency department for procedure to relieve obstruction.  Patient states that he may have had right-sided abdominal and flank pain several weeks ago.  I spoke with Dr. Patria Mane who  is aware of patient being transferred.  Patient and wife updated.  Last oral intake was 1 PM today.  Patient states that he vomited afterwards.   Final Clinical Impressions(s) / ED Diagnoses   Final diagnoses:  Left ureteral stone  Right ureteral stone  Acute kidney injury (nontraumatic) (HCC)   Transfer to [facility] for urologic intervention.   ED Discharge Orders    None       Renne Crigler, Cordelia Poche 01/01/18 2144    Sabas Sous, MD 01/01/18 2228

## 2018-01-01 NOTE — H&P (View-Only) (Signed)
Urology Admission H&P  Chief Complaint: right flank pain  History of Present Illness: George Bishop is a 62yo with a hx of nephrolithiasis who presented to the ER with a 2 day hx of severe sharp, constant nonradiating right flank pain. He had associated nausea and vomiting. No fevers. No LUTS. His last stone event was in 2007. CT scan showed bilateral proximal ureteral calculi. Creatinien 1.69  Past Medical History:  Diagnosis Date  . Hypertension   . Nephrolithiasis    Past Surgical History:  Procedure Laterality Date  . URETEROSCOPY  2007    Home Medications:  Current Facility-Administered Medications  Medication Dose Route Frequency Provider Last Rate Last Dose  . 0.9 % irrigation (POUR BTL)    PRN George Bishop   1,000 mL at 01/01/18 2304  . ceFAZolin (ANCEF) 2-4 GM/100ML-% IVPB           . sodium chloride irrigation 0.9 %    PRN George Bishop   3,000 mL at 01/01/18 2259   Current Outpatient Medications  Medication Sig Dispense Refill Last Dose  . azithromycin (ZITHROMAX) 250 MG tablet Take 2 tabs PO x 1 dose, then 1 tab PO QD x 4 days (Patient not taking: Reported on 01/01/2018) 6 tablet 0 Completed Course at Unknown time  . benzonatate (TESSALON) 200 MG capsule Take 1 capsule (200 mg total) by mouth 2 (two) times daily as needed for cough. (Patient not taking: Reported on 01/01/2018) 20 capsule 0 Completed Course at Unknown time  . cetirizine (ZYRTEC) 10 MG tablet Take 1 tablet (10 mg total) by mouth daily. (Patient not taking: Reported on 01/01/2018) 30 tablet 11 Completed Course at Unknown time  . cyclobenzaprine (FLEXERIL) 5 MG tablet Take 1-2 tablets (5-10 mg total) by mouth 3 (three) times daily as needed for muscle spasms. (Patient not taking: Reported on 09/22/2017) 30 tablet 3 Completed Course at Unknown time  . fluticasone (FLONASE) 50 MCG/ACT nasal spray Place 2 sprays into both nostrils daily. (Patient not taking: Reported on 01/01/2018) 16 g 6 Completed Course  at Unknown time  . HYDROcodone-homatropine (HYCODAN) 5-1.5 MG/5ML syrup Take 5 mLs by mouth every 8 (eight) hours as needed for cough. (Patient not taking: Reported on 01/01/2018) 120 mL 0 Completed Course at Unknown time  . meloxicam (MOBIC) 7.5 MG tablet TAKE 1 TABLET (7.5 MG TOTAL) BY MOUTH 2 (TWO) TIMES DAILY AS NEEDED FOR PAIN. (Patient not taking: Reported on 09/22/2017) 30 tablet 2 Completed Course at Unknown time   Allergies: No Known Allergies  History reviewed. No pertinent family history. Social History:  reports that he has never smoked. He has never used smokeless tobacco. He reports that he does not drink alcohol or use drugs.  Review of Systems  Gastrointestinal: Positive for nausea and vomiting.  Genitourinary: Positive for flank pain.  All other systems reviewed and are negative.   Physical Exam:  Vital signs in last 24 hours: Temp:  [97.6 F (36.4 C)] 97.6 F (36.4 C) (08/29 1719) Pulse Rate:  [64-78] 78 (08/29 2230) Resp:  [15-21] 18 (08/29 2230) BP: (135-162)/(76-99) 143/79 (08/29 2230) SpO2:  [94 %-100 %] 100 % (08/29 2130) Physical Exam  Constitutional: He is oriented to person, place, and time. He appears well-developed and well-nourished.  HENT:  Head: Normocephalic and atraumatic.  Eyes: Pupils are equal, round, and reactive to light. EOM are normal.  Neck: Normal range of motion. No thyromegaly present.  Cardiovascular: Normal rate and regular rhythm.  Respiratory: Effort  normal. No respiratory distress.  GI: Soft. He exhibits no distension.  Musculoskeletal: Normal range of motion. He exhibits no edema.  Neurological: He is alert and oriented to person, place, and time.  Skin: Skin is warm and dry.  Psychiatric: He has a normal mood and affect. His behavior is normal. Judgment and thought content normal.    Laboratory Data:  Results for orders placed or performed during the hospital encounter of 01/01/18 (from the past 24 hour(s))  Lipase, blood      Status: None   Collection Time: 01/01/18  5:45 PM  Result Value Ref Range   Lipase 33 11 - 51 U/L  Comprehensive metabolic panel     Status: Abnormal   Collection Time: 01/01/18  5:45 PM  Result Value Ref Range   Sodium 141 135 - 145 mmol/L   Potassium 3.9 3.5 - 5.1 mmol/L   Chloride 103 98 - 111 mmol/L   CO2 24 22 - 32 mmol/L   Glucose, Bld 138 (H) 70 - 99 mg/dL   BUN 18 8 - 23 mg/dL   Creatinine, Ser 6.571.69 (H) 0.61 - 1.24 mg/dL   Calcium 9.7 8.9 - 84.610.3 mg/dL   Total Protein 7.7 6.5 - 8.1 g/dL   Albumin 4.7 3.5 - 5.0 g/dL   AST 22 15 - 41 U/L   ALT 22 0 - 44 U/L   Alkaline Phosphatase 47 38 - 126 U/L   Total Bilirubin 1.6 (H) 0.3 - 1.2 mg/dL   GFR calc non Af Amer 41 (L) >60 mL/min   GFR calc Af Amer 48 (L) >60 mL/min   Anion gap 14 5 - 15  CBC     Status: None   Collection Time: 01/01/18  5:45 PM  Result Value Ref Range   WBC 9.4 4.0 - 10.5 K/uL   RBC 5.44 4.22 - 5.81 MIL/uL   Hemoglobin 15.9 13.0 - 17.0 g/dL   HCT 96.249.6 95.239.0 - 84.152.0 %   MCV 91.2 78.0 - 100.0 fL   MCH 29.2 26.0 - 34.0 pg   MCHC 32.1 30.0 - 36.0 g/dL   RDW 32.413.3 40.111.5 - 02.715.5 %   Platelets 195 150 - 400 K/uL  Urinalysis, Routine w reflex microscopic     Status: Abnormal   Collection Time: 01/01/18  6:19 PM  Result Value Ref Range   Color, Urine YELLOW YELLOW   APPearance CLOUDY (A) CLEAR   Specific Gravity, Urine 1.016 1.005 - 1.030   pH 5.0 5.0 - 8.0   Glucose, UA NEGATIVE NEGATIVE mg/dL   Hgb urine dipstick LARGE (A) NEGATIVE   Bilirubin Urine NEGATIVE NEGATIVE   Ketones, ur 20 (A) NEGATIVE mg/dL   Protein, ur 30 (A) NEGATIVE mg/dL   Nitrite NEGATIVE NEGATIVE   Leukocytes, UA NEGATIVE NEGATIVE   RBC / HPF 21-50 0 - 5 RBC/hpf   WBC, UA 0-5 0 - 5 WBC/hpf   Bacteria, UA RARE (A) NONE SEEN   Squamous Epithelial / LPF 0-5 0 - 5   Mucus PRESENT    Ca Oxalate Crys, UA PRESENT    No results found for this or any previous visit (from the past 240 hour(s)). Creatinine: Recent Labs    01/01/18 1745   CREATININE 1.69*   Baseline Creatinine: unknown  Impression/Assessment:  62yo with bilateral ureteral calculi  Plan:  The risks/benefits/alternatives to bilateral ureteral stent placement was explained to the patient and he understands and wishes to proceed with surgery  George AyePatrick McKenzie 01/01/2018, 11:10 PM

## 2018-01-02 ENCOUNTER — Encounter (HOSPITAL_COMMUNITY): Payer: Self-pay | Admitting: Urology

## 2018-01-08 ENCOUNTER — Other Ambulatory Visit: Payer: Self-pay | Admitting: Urology

## 2018-01-12 ENCOUNTER — Encounter (HOSPITAL_BASED_OUTPATIENT_CLINIC_OR_DEPARTMENT_OTHER): Payer: Self-pay

## 2018-01-16 ENCOUNTER — Other Ambulatory Visit: Payer: Self-pay

## 2018-01-16 ENCOUNTER — Encounter (HOSPITAL_BASED_OUTPATIENT_CLINIC_OR_DEPARTMENT_OTHER): Payer: Self-pay | Admitting: *Deleted

## 2018-01-16 NOTE — Progress Notes (Addendum)
Spoke w/ pt via phone thru [place]ese [place] interpreter [phone] for pre-op interview.  Pt verbalized understanding npo after mn with exception clear liquids until 0630 am dos (water, gatorade, sprite, ginger ale, black coffee with no cream/ milk products) and arrive at [facility] @ 1030.  May take oxycodone or tylenol am dos w/ sips of water if needed.  Requested burmese interpreter via email to interpreting [place] and requested confirmation (copy of email with chart).  Current lab results dated 01-01-2018 in chart and epic.

## 2018-01-20 ENCOUNTER — Ambulatory Visit (HOSPITAL_BASED_OUTPATIENT_CLINIC_OR_DEPARTMENT_OTHER): Payer: BLUE CROSS/BLUE SHIELD | Admitting: Anesthesiology

## 2018-01-20 ENCOUNTER — Encounter (HOSPITAL_BASED_OUTPATIENT_CLINIC_OR_DEPARTMENT_OTHER)
Admission: RE | Disposition: A | Discharge status: Home / Self Care | Payer: Self-pay | Source: Ambulatory Visit | Attending: Urology

## 2018-01-20 ENCOUNTER — Encounter (HOSPITAL_BASED_OUTPATIENT_CLINIC_OR_DEPARTMENT_OTHER): Payer: Self-pay | Admitting: *Deleted

## 2018-01-20 ENCOUNTER — Ambulatory Visit (HOSPITAL_BASED_OUTPATIENT_CLINIC_OR_DEPARTMENT_OTHER)
Admission: RE | Admit: 2018-01-20 | Discharge: 2018-01-20 | Disposition: A | Discharge status: Home / Self Care | Payer: BLUE CROSS/BLUE SHIELD | Source: Ambulatory Visit | Attending: Urology | Admitting: Urology

## 2018-01-20 ENCOUNTER — Ambulatory Visit (HOSPITAL_COMMUNITY): Payer: BLUE CROSS/BLUE SHIELD

## 2018-01-20 ENCOUNTER — Other Ambulatory Visit: Payer: Self-pay

## 2018-01-20 DIAGNOSIS — G709 Myoneural disorder, unspecified: Secondary | ICD-10-CM | POA: Diagnosis not present

## 2018-01-20 DIAGNOSIS — I10 Essential (primary) hypertension: Secondary | ICD-10-CM | POA: Insufficient documentation

## 2018-01-20 DIAGNOSIS — I739 Peripheral vascular disease, unspecified: Secondary | ICD-10-CM | POA: Insufficient documentation

## 2018-01-20 DIAGNOSIS — N200 Calculus of kidney: Secondary | ICD-10-CM | POA: Diagnosis not present

## 2018-01-20 HISTORY — PX: CYSTOSCOPY WITH RETROGRADE PYELOGRAM, URETEROSCOPY AND STENT PLACEMENT: SHX5789

## 2018-01-20 HISTORY — DX: Calculus of ureter: N20.1

## 2018-01-20 HISTORY — DX: Personal history of urinary calculi: Z87.442

## 2018-01-20 HISTORY — DX: Atherosclerosis of aorta: I70.0

## 2018-01-20 SURGERY — CYSTOURETEROSCOPY, WITH RETROGRADE PYELOGRAM AND STENT INSERTION
Anesthesia: General | Laterality: Bilateral

## 2018-01-20 MED ORDER — IOHEXOL 300 MG/ML  SOLN
INTRAMUSCULAR | Status: DC | PRN
  Administered 2018-01-20: 6 mL via URETHRAL

## 2018-01-20 MED ORDER — TAMSULOSIN HCL 0.4 MG PO CAPS: 0.4000 mg | ORAL_CAPSULE | Freq: Every day | ORAL | 0 refills | Status: DC

## 2018-01-20 MED ORDER — LIDOCAINE 2% (20 MG/ML) 5 ML SYRINGE
INTRAMUSCULAR | Status: AC
  Filled 2018-01-20: qty 5

## 2018-01-20 MED ORDER — OXYCODONE-ACETAMINOPHEN 5-325 MG PO TABS: 1.0000 | ORAL_TABLET | ORAL | 0 refills | Status: AC | PRN

## 2018-01-20 MED ORDER — FENTANYL CITRATE (PF) 100 MCG/2ML IJ SOLN
INTRAMUSCULAR | Status: AC
  Filled 2018-01-20: qty 2

## 2018-01-20 MED ORDER — PROPOFOL 10 MG/ML IV BOLUS
INTRAVENOUS | Status: AC
  Filled 2018-01-20: qty 20

## 2018-01-20 MED ORDER — MIDAZOLAM HCL 2 MG/2ML IJ SOLN
INTRAMUSCULAR | Status: AC
  Filled 2018-01-20: qty 2

## 2018-01-20 MED ORDER — ONDANSETRON HCL 4 MG/2ML IJ SOLN
INTRAMUSCULAR | Status: AC
  Filled 2018-01-20: qty 2

## 2018-01-20 MED ORDER — PROPOFOL 10 MG/ML IV BOLUS
INTRAVENOUS | Status: DC | PRN
  Administered 2018-01-20: 150 mg via INTRAVENOUS

## 2018-01-20 MED ORDER — MIDAZOLAM HCL 2 MG/2ML IJ SOLN
INTRAMUSCULAR | Status: DC | PRN
  Administered 2018-01-20: 2 mg via INTRAVENOUS

## 2018-01-20 MED ORDER — FENTANYL CITRATE (PF) 100 MCG/2ML IJ SOLN
25.0000 ug | INTRAMUSCULAR | Status: DC | PRN
  Administered 2018-01-20: 25 ug via INTRAVENOUS
  Filled 2018-01-20: qty 1

## 2018-01-20 MED ORDER — CEFTRIAXONE SODIUM 2 G IJ SOLR
INTRAMUSCULAR | Status: AC
  Filled 2018-01-20: qty 20

## 2018-01-20 MED ORDER — LIDOCAINE 2% (20 MG/ML) 5 ML SYRINGE
INTRAMUSCULAR | Status: DC | PRN
  Administered 2018-01-20: 80 mg via INTRAVENOUS

## 2018-01-20 MED ORDER — SODIUM CHLORIDE 0.9 % IV SOLN
INTRAVENOUS | Status: DC
  Administered 2018-01-20: 11:00:00 via INTRAVENOUS
  Filled 2018-01-20: qty 1000

## 2018-01-20 MED ORDER — OXYCODONE HCL 5 MG PO TABS
ORAL_TABLET | ORAL | Status: AC
  Filled 2018-01-20: qty 1

## 2018-01-20 MED ORDER — SODIUM CHLORIDE 0.9 % IV SOLN
2.0000 g | INTRAVENOUS | Status: AC
  Administered 2018-01-20: 2 g via INTRAVENOUS
  Filled 2018-01-20: qty 20

## 2018-01-20 MED ORDER — SODIUM CHLORIDE 0.9 % IV SOLN
INTRAVENOUS | Status: AC
  Filled 2018-01-20: qty 100

## 2018-01-20 MED ORDER — ONDANSETRON HCL 4 MG/2ML IJ SOLN
INTRAMUSCULAR | Status: DC | PRN
  Administered 2018-01-20: 4 mg via INTRAVENOUS

## 2018-01-20 MED ORDER — OXYCODONE HCL 5 MG/5ML PO SOLN
5.0000 mg | Freq: Once | ORAL | Status: AC | PRN
  Filled 2018-01-20: qty 5

## 2018-01-20 MED ORDER — OXYCODONE HCL 5 MG PO TABS
5.0000 mg | ORAL_TABLET | Freq: Once | ORAL | Status: AC | PRN
  Administered 2018-01-20: 5 mg via ORAL
  Filled 2018-01-20: qty 1

## 2018-01-20 MED ORDER — SULFAMETHOXAZOLE-TRIMETHOPRIM 800-160 MG PO TABS: 1.0000 | ORAL_TABLET | Freq: Two times a day (BID) | ORAL | 0 refills | Status: DC

## 2018-01-20 MED ORDER — ONDANSETRON HCL 4 MG/2ML IJ SOLN
4.0000 mg | Freq: Four times a day (QID) | INTRAMUSCULAR | Status: DC | PRN
  Filled 2018-01-20: qty 2

## 2018-01-20 MED ORDER — FENTANYL CITRATE (PF) 100 MCG/2ML IJ SOLN
INTRAMUSCULAR | Status: DC | PRN
  Administered 2018-01-20: 100 ug via INTRAVENOUS

## 2018-01-20 SURGICAL SUPPLY — 29 items
BAG DRAIN URO-CYSTO SKYTR STRL (DRAIN) ×2 IMPLANT
BASKET STONE 1.7 NGAGE (UROLOGICAL SUPPLIES) ×2 IMPLANT
CATH INTERMIT  6FR 70CM (CATHETERS) ×2 IMPLANT
CLOTH BEACON ORANGE TIMEOUT ST (SAFETY) ×2 IMPLANT
EVACUATOR MICROVAS BLADDER (UROLOGICAL SUPPLIES) IMPLANT
EXTRACTOR STONE 1.7FRX115CM (UROLOGICAL SUPPLIES) IMPLANT
FIBER LASER FLEXIVA 1000 (UROLOGICAL SUPPLIES) IMPLANT
FIBER LASER FLEXIVA 200 (UROLOGICAL SUPPLIES) IMPLANT
FIBER LASER FLEXIVA 365 (UROLOGICAL SUPPLIES) IMPLANT
FIBER LASER FLEXIVA 550 (UROLOGICAL SUPPLIES) IMPLANT
FIBER LASER TRAC TIP (UROLOGICAL SUPPLIES) IMPLANT
GLOVE BIO SURGEON STRL SZ8 (GLOVE) ×2 IMPLANT
GOWN STRL REUS W/TWL XL LVL3 (GOWN DISPOSABLE) ×2 IMPLANT
GUIDEWIRE ANG ZIPWIRE 038X150 (WIRE) ×2 IMPLANT
GUIDEWIRE STR DUAL SENSOR (WIRE) ×2 IMPLANT
INFUSOR MANOMETER BAG 3000ML (MISCELLANEOUS) IMPLANT
IV NS 1000ML (IV SOLUTION)
IV NS 1000ML BAXH (IV SOLUTION) IMPLANT
IV NS IRRIG 3000ML ARTHROMATIC (IV SOLUTION) ×4 IMPLANT
KIT TURNOVER CYSTO (KITS) ×2 IMPLANT
MANIFOLD NEPTUNE II (INSTRUMENTS) ×2 IMPLANT
NS IRRIG 500ML POUR BTL (IV SOLUTION) ×2 IMPLANT
PACK CYSTO (CUSTOM PROCEDURE TRAY) ×2 IMPLANT
SHEATH URETERAL 12FRX35CM (MISCELLANEOUS) ×4 IMPLANT
STENT URET 6FRX24 CONTOUR (STENTS) ×2 IMPLANT
STENT URET 6FRX26 CONTOUR (STENTS) IMPLANT
SYR 10ML LL (SYRINGE) ×2 IMPLANT
TUBE CONNECTING 12X1/4 (SUCTIONS) ×2 IMPLANT
TUBING UROLOGY SET (TUBING) ×2 IMPLANT

## 2018-01-20 NOTE — Discharge Instructions (Signed)
Ureteral Stent Implantation, Care After °Refer to this sheet in the next few weeks. These instructions provide you with information about caring for yourself after your procedure. Your health care provider may also give you more specific instructions. Your treatment has been planned according to current medical practices, but problems sometimes occur. Call your health care provider if you have any problems or questions after your procedure. °What can I expect after the procedure? °After the procedure, it is common to have: °· Nausea. °· Mild pain when you urinate. You may feel this pain in your lower back or lower abdomen. Pain should stop within a few minutes after you urinate. This may last for up to 1 week. °· A small amount of blood in your urine for several days. ° °Follow these instructions at home: ° °Medicines °· Take over-the-counter and prescription medicines only as told by your health care provider. °· If you were prescribed an antibiotic medicine, take it as told by your health care provider. Do not stop taking the antibiotic even if you start to feel better. °· Do not drive for 24 hours if you received a sedative. °· Do not drive or operate heavy machinery while taking prescription pain medicines. °Activity °· Return to your normal activities as told by your health care provider. Ask your health care provider what activities are safe for you. °· Do not lift anything that is heavier than 10 lb (4.5 kg). Follow this limit for 1 week after your procedure, or for as long as told by your health care provider. °General instructions °· Watch for any blood in your urine. Call your health care provider if the amount of blood in your urine increases. °· If you have a catheter: °? Follow instructions from your health care provider about taking care of your catheter and collection bag. °? Do not take baths, swim, or use a hot tub until your health care provider approves. °· Drink enough fluid to keep your urine  clear or pale yellow. °· Keep all follow-up visits as told by your health care provider. This is important. °Contact a health care provider if: °· You have pain that gets worse or does not get better with medicine, especially pain when you urinate. °· You have difficulty urinating. °· You feel nauseous or you vomit repeatedly during a period of more than 2 days after the procedure. °Get help right away if: °· Your urine is dark red or has blood clots in it. °· You are leaking urine (have incontinence). °· The end of the stent comes out of your urethra. °· You cannot urinate. °· You have sudden, sharp, or severe pain in your abdomen or lower back. °· You have a fever. °This information is not intended to replace advice given to you by your health care provider. Make sure you discuss any questions you have with your health care provider. °Document Released: 12/23/2012 Document Revised: 09/28/2015 Document Reviewed: 11/04/2014 °Elsevier Interactive Patient Education © 2018 [employer] ° °PLEASE REMOVE YOUR STENT IN 72 HOURS BY GENTLY PULLING THE STRING ° ° °Post Anesthesia Home Care Instructions ° °Activity: °Get plenty of rest for the remainder of the day. A responsible adult should stay with you for 24 hours following the procedure.  °For the next 24 hours, DO NOT: °-Drive a car °-Operate machinery °-Drink alcoholic beverages °-Take any medication unless instructed by your physician °-Make any legal decisions or sign important papers. ° °Meals: °Start with liquid foods such as gelatin or soup.   Progress to regular foods as tolerated. Avoid greasy, spicy, heavy foods. If nausea and/or vomiting occur, drink only clear liquids until the nausea and/or vomiting subsides. Call your physician if vomiting continues. ° °Special Instructions/Symptoms: °Your throat may feel dry or sore from the anesthesia or the breathing tube placed in your throat during surgery. If this causes discomfort, gargle with warm salt water. The  discomfort should disappear within 24 hours. ° °If you had a scopolamine patch placed behind your ear for the management of post- operative nausea and/or vomiting: ° °1. The medication in the patch is effective for 72 hours, after which it should be removed.  Wrap patch in a tissue and discard in the trash. Wash hands thoroughly with soap and water. °2. You may remove the patch earlier than 72 hours if you experience unpleasant side effects which may include dry mouth, dizziness or visual disturbances. °3. Avoid touching the patch. Wash your hands with soap and water after contact with the patch. °  ° ° °

## 2018-01-20 NOTE — Interval H&P Note (Signed)
History and Physical Interval Note:  01/20/2018 12:52 PM  George Bishop  has presented today for surgery, with the diagnosis of BILATERAL URETERAL STONES  The various methods of treatment have been discussed with the patient and family. After consideration of risks, benefits and other options for treatment, the patient has consented to  Procedure(s) with comments: CYSTOSCOPY WITH RETROGRADE PYELOGRAM, URETEROSCOPY AND STENT PLACEMENT (Bilateral) - 1 HR  STENT EXCHANGE HOLMIUM LASER APPLICATION (Bilateral) as a surgical intervention .  The patient's history has been reviewed, patient examined, no change in status, stable for surgery.  I have reviewed the patient's chart and labs.  Questions were answered to the patient's satisfaction.     Wilkie AyePatrick Evangelyn Crouse

## 2018-01-20 NOTE — Transfer of Care (Signed)
Immediate Anesthesia Transfer of Care Note  Patient: George Bishop  Procedure(s) Performed: CYSTOSCOPY WITH RETROGRADE PYELOGRAM, URETEROSCOPY AND STENT PLACEMENT (Bilateral )  Patient Location: PACU  Anesthesia Type:General  Level of Consciousness: awake, alert  and oriented  Airway & Oxygen Therapy: Patient Spontanous Breathing and Patient connected to nasal cannula oxygen  Post-op Assessment: Report given to RN and Post -op Vital signs reviewed and stable  Post vital signs: Reviewed and stable  Last Vitals:  Vitals Value Taken Time  BP 155/86 01/20/2018  2:12 PM  Temp    Pulse 82 01/20/2018  2:15 PM  Resp 17 01/20/2018  2:15 PM  SpO2 98 % 01/20/2018  2:15 PM  Vitals shown include unvalidated device data.  Last Pain:  Vitals:   01/20/18 1058  TempSrc:   PainSc: 0-No pain      Patients Stated Pain Goal: 5 (01/20/18 1058)  Complications: No apparent anesthesia complications

## 2018-01-20 NOTE — Brief Op Note (Signed)
01/20/2018  1:58 PM  PATIENT:  Richards Nyein Jett  62 y.o. male  PRE-OPERATIVE DIAGNOSIS:  BILATERAL URETERAL STONES  POST-OPERATIVE DIAGNOSIS:  BILATERAL URETERAL STONES  PROCEDURE:  Procedure(s) with comments: CYSTOSCOPY WITH RETROGRADE PYELOGRAM, URETEROSCOPY AND STENT PLACEMENT (Bilateral) - 1 HR  STENT EXCHANGE  SURGEON:  Surgeon(s) and Role:    * Cydnee Fuquay, Mardene CelestePatrick L, MD - Primary  PHYSICIAN ASSISTANT:   ASSISTANTS: none   ANESTHESIA:   general  EBL:  None    BLOOD ADMINISTERED:none  DRAINS: bilateral 6x24 JJ ureteral stent with tether   LOCAL MEDICATIONS USED:  NONE  SPECIMEN:  Source of Specimen:  bilateral renaL CALCULI  DISPOSITION OF SPECIMEN:  PATHOLOGY  COUNTS:  YES  TOURNIQUET:  * No tourniquets in log *  DICTATION: .Note written in EPIC  PLAN OF CARE: Discharge to home after PACU  PATIENT DISPOSITION:  PACU - hemodynamically stable.   Delay start of Pharmacological VTE agent (>24hrs) due to surgical blood loss or risk of bleeding: not applicable

## 2018-01-20 NOTE — Anesthesia Procedure Notes (Signed)
Procedure Name: LMA Insertion Date/Time: 01/20/2018 1:03 PM Performed by: Shanon PayorGregory, Gesselle Fitzsimons M, CRNA Pre-anesthesia Checklist: Patient identified, Emergency Drugs available, Suction available, Patient being monitored and Timeout performed Patient Re-evaluated:Patient Re-evaluated prior to induction Oxygen Delivery Method: Circle system utilized Preoxygenation: Pre-oxygenation with 100% oxygen Induction Type: IV induction Ventilation: Mask ventilation without difficulty LMA: LMA inserted LMA Size: 4.0 Number of attempts: 1 Tube secured with: Tape Dental Injury: Teeth and Oropharynx as per pre-operative assessment

## 2018-01-20 NOTE — Anesthesia Preprocedure Evaluation (Signed)
Anesthesia Evaluation  Patient identified by MRN, date of birth, ID band Patient awake    Reviewed: Allergy & Precautions, H&P , NPO status , Patient's Chart, lab work & pertinent test results  Airway Mallampati: II   Neck ROM: full    Dental   Pulmonary Current Smoker,    breath sounds clear to auscultation       Cardiovascular + Peripheral Vascular Disease   Rhythm:regular Rate:Normal     Neuro/Psych  Neuromuscular disease    GI/Hepatic   Endo/Other    Renal/GU Renal diseasestones     Musculoskeletal   Abdominal   Peds  Hematology   Anesthesia Other Findings   Reproductive/Obstetrics                             Anesthesia Physical Anesthesia Plan  ASA: II  Anesthesia Plan: General   Post-op Pain Management:    Induction: Intravenous  PONV Risk Score and Plan: 1 and Ondansetron, Dexamethasone, Midazolam and Treatment may vary due to age or medical condition  Airway Management Planned: LMA  Additional Equipment:   Intra-op Plan:   Post-operative Plan:   Informed Consent: I have reviewed the patients History and Physical, chart, labs and discussed the procedure including the risks, benefits and alternatives for the proposed anesthesia with the patient or authorized representative who has indicated his/her understanding and acceptance.     Plan Discussed with: CRNA, Anesthesiologist and Surgeon  Anesthesia Plan Comments:         Anesthesia Quick Evaluation

## 2018-01-21 ENCOUNTER — Encounter (HOSPITAL_BASED_OUTPATIENT_CLINIC_OR_DEPARTMENT_OTHER): Payer: Self-pay | Admitting: Urology

## 2018-01-21 NOTE — Anesthesia Postprocedure Evaluation (Signed)
Anesthesia Post Note  Patient: Jacai Nyein Blakley  Procedure(s) Performed: CYSTOSCOPY WITH RETROGRADE PYELOGRAM, URETEROSCOPY AND STENT PLACEMENT (Bilateral )     Patient location during evaluation: PACU Anesthesia Type: General Level of consciousness: awake and alert Pain management: pain level controlled Vital Signs Assessment: post-procedure vital signs reviewed and stable Respiratory status: spontaneous breathing, nonlabored ventilation, respiratory function stable and patient connected to nasal cannula oxygen Cardiovascular status: blood pressure returned to baseline and stable Postop Assessment: no apparent nausea or vomiting Anesthetic complications: no    Last Vitals:  Vitals:   01/20/18 1445 01/20/18 1610  BP: (!) 163/99 (!) 164/96  Pulse: 74 79  Resp: 11 14  Temp:  36.7 C  SpO2: 97% 99%    Last Pain:  Vitals:   01/20/18 1555  TempSrc:   PainSc: 3                  Lucretia Kernarolyn E Shyna Duignan

## 2018-01-22 NOTE — Op Note (Signed)
Marland Kitchen.Preoperative diagnosis: bilateral renal calculi  Postoperative diagnosis: Same  Procedure: 1 cystoscopy 2. bilateralretrograde pyelography 3.  Intraoperative fluoroscopy, under one hour, with interpretation 4.  Bilateral ureteroscopic stone manipulation with basket extraction 5.  bilateral 6 x 24 JJ stent exchange  Attending: Cleda MccreedyPatrick Mackenzie  Anesthesia: General  Estimated blood loss: None  Drains: bilateral 6 x 24 JJ ureteral stent with tether  Specimens: stone for analysis  Antibiotics: rocephin  Findings: bilateral renal pelvis calculi. No hydronephrosis. No masses/lesions in the bladder. Ureteral orifices in normal anatomic location.  Indications: Patient is a 62 year old male with a history of bilateral renal calculi and bilateral flank pain. He underwent bilateral stent placement 3 weeks ago. After discussing treatment options, they decided proceed with bilateral ureteroscopic stone manipulation.  Procedure her in detail: The patient was brought to the operating room and a brief timeout was done to ensure correct patient, correct procedure, correct site.  General anesthesia was administered patient was placed in dorsal lithotomy position.  Her genitalia was then prepped and draped in usual sterile fashion.  A rigid 22 French cystoscope was passed in the urethra and the bladder.  Bladder was inspected free masses or lesions.  the ureteral orifices were in the normal orthotopic locations. Using a grasper the left ureteral stent was brought to the urethral meatus. We then advanced a zipwire through the stent and up to the renal pelvis.  a 6 french ureteral catheter was then instilled into the left ureteral orifice.  a gentle retrograde was obtained and findings noted above.  we then removed the cystoscope and cannulated the left ureteral orifice with a semirigid ureteroscope.  We located no stone in the ureter. We then placed a sensor wire up to the renal pelvis. We removed the scope  and advanced a 12/14 x 35cm access sheath up to the renal pelvis. We then used the flexible ureteroscope to perform nephroscopy. We located calculi in the renal pelvis which were removed with an NGage basket. Once the stone were removed we then removed the access sheath under direct vision and noted to injury to the ureter.  We then placed a 6 x 24 double-j ureteral stent over the original zip wire. We then removed the wire and good coil was noted in the the renal pelvis under fluoroscopy and the bladder under direct vision.  We then turned out attention to the right side. Using a grasper the right ureteral stent was brought to the urethral meatus. We then advanced a zipwire through the stent and up to the renal pelvis. a 6 french ureteral catheter was then instilled into the right ureteral orifice.  a gentle retrograde was obtained and findings noted above. we then removed the cystoscope and cannulated the right ureteral orifice with a semirigid ureteroscope.  We located no stone in the ureter. We then placed a sensor wire up to the renal pelvis. We removed the scope and advanced a 12/14 x 35cm access sheath up to the renal pelvis. We then used the flexible ureteroscope to perform nephroscopy. We located calculi in the renal pelvis which was removed with an NGage basket. Once the stone were removed we then removed the access sheath under direct vision and noted to injury to the ureter. we then placed a 6 x 24 double-j ureteral stent over the original zip wire.  We then removed the wire and good coil was noted in the the renal pelvis under fluoroscopy and the bladder under direct vision.   the bladder was  then drained and this concluded the procedure which was well tolerated by patient.  Complications: None  Condition: Stable, extubated, transferred to PACU  Plan: Patient is to be discharged home as to follow-up in 2 weeks. She is to remove her stents by pulling the tehter in 72 hours

## 2019-06-20 ENCOUNTER — Ambulatory Visit: Payer: Medicare Other | Attending: Internal Medicine

## 2019-06-20 DIAGNOSIS — Z23 Encounter for immunization: Secondary | ICD-10-CM | POA: Insufficient documentation

## 2019-06-20 NOTE — Progress Notes (Signed)
   Covid-19 Vaccination Clinic  Name:  George Bishop    MRN: [id number] DOB: 1955/11/11  06/20/2019  [name] was observed post Covid-19 immunization for 15 minutes without incidence. He was provided with Vaccine Information Sheet and instruction to access the V-Safe system.   [name] was instructed to call 911 with any severe reactions post vaccine: Marland Kitchen Difficulty breathing  . Swelling of your face and throat  . A fast heartbeat  . A bad rash all over your body  . Dizziness and weakness    Immunizations Administered    Name Date Dose VIS Date Route   Pfizer COVID-19 Vaccine 06/20/2019  9:51 AM 0.3 mL 04/16/2019 Intramuscular   Manufacturer: [employer], [employer]   Lot: [id number]   NDC: [address]-6

## 2019-07-13 ENCOUNTER — Ambulatory Visit: Payer: Medicare Other | Attending: Internal Medicine

## 2019-07-13 DIAGNOSIS — Z23 Encounter for immunization: Secondary | ICD-10-CM

## 2019-07-13 NOTE — Progress Notes (Signed)
   Covid-19 Vaccination Clinic  Name:  George Bishop    MRN: [id number] DOB: [birth year]  07/13/2019  Mr. George Bishop was observed post Covid-19 immunization for 15 minutes without incident. He was provided with Vaccine Information Sheet and instruction to access the V-Safe system.   Mr. George Bishop was instructed to call 911 with any severe reactions post vaccine: Marland Kitchen Difficulty breathing  . Swelling of face and throat  . A fast heartbeat  . A bad rash all over body  . Dizziness and weakness   Immunizations Administered    Name Date Dose VIS Date Route   Pfizer COVID-19 Vaccine 07/13/2019 10:25 AM 0.3 mL 04/16/2019 Intramuscular   Manufacturer: [employer], [employer]   Lot: [id number]   NDC: [address]-4

## 2020-10-13 DIAGNOSIS — R590 Localized enlarged lymph nodes: Secondary | ICD-10-CM | POA: Diagnosis not present

## 2020-10-23 DIAGNOSIS — H903 Sensorineural hearing loss, bilateral: Secondary | ICD-10-CM | POA: Diagnosis not present

## 2020-11-29 ENCOUNTER — Ambulatory Visit (INDEPENDENT_AMBULATORY_CARE_PROVIDER_SITE_OTHER): Payer: Medicare Other | Admitting: Family Medicine

## 2020-11-29 ENCOUNTER — Encounter: Payer: Self-pay | Admitting: Family Medicine

## 2020-11-29 ENCOUNTER — Other Ambulatory Visit: Payer: Self-pay

## 2020-11-29 VITALS — BP 142/86 | HR 75 | Temp 97.2°F | Ht 69.0 in | Wt 153.0 lb

## 2020-11-29 DIAGNOSIS — I10 Essential (primary) hypertension: Secondary | ICD-10-CM | POA: Diagnosis not present

## 2020-11-29 DIAGNOSIS — H9113 Presbycusis, bilateral: Secondary | ICD-10-CM | POA: Diagnosis not present

## 2020-11-29 DIAGNOSIS — Z0000 Encounter for general adult medical examination without abnormal findings: Secondary | ICD-10-CM | POA: Insufficient documentation

## 2020-11-29 DIAGNOSIS — Z125 Encounter for screening for malignant neoplasm of prostate: Secondary | ICD-10-CM | POA: Insufficient documentation

## 2020-11-29 DIAGNOSIS — I739 Peripheral vascular disease, unspecified: Secondary | ICD-10-CM | POA: Insufficient documentation

## 2020-11-29 DIAGNOSIS — Z720 Tobacco use: Secondary | ICD-10-CM

## 2020-11-29 DIAGNOSIS — R7309 Other abnormal glucose: Secondary | ICD-10-CM | POA: Diagnosis not present

## 2020-11-29 DIAGNOSIS — F418 Other specified anxiety disorders: Secondary | ICD-10-CM | POA: Insufficient documentation

## 2020-11-29 DIAGNOSIS — R3121 Asymptomatic microscopic hematuria: Secondary | ICD-10-CM

## 2020-11-29 DIAGNOSIS — Z87891 Personal history of nicotine dependence: Secondary | ICD-10-CM | POA: Insufficient documentation

## 2020-11-29 MED ORDER — MIRTAZAPINE 15 MG PO TABS: 15.0000 mg | ORAL_TABLET | Freq: Every day | ORAL | 2 refills | Status: DC

## 2020-11-29 MED ORDER — LISINOPRIL 10 MG PO TABS: 10.0000 mg | ORAL_TABLET | Freq: Every day | ORAL | 3 refills | Status: DC

## 2020-11-29 NOTE — Progress Notes (Addendum)
Established Patient Office Visit  Subjective:  Patient ID: George Bishop, male    DOB: 1956/02/22  Age: 65 y.o. MRN: [id number]  CC:  Chief Complaint  Patient presents with   Establish Care    NP/establish care concerns about left side of neck x few months. Not able to hear well out of both ears no pains very itchy all the time.     HPI George Bishop presents for establishment of care and follow-up of multiple medical issues.  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  He has been sad and anxious about the Civil War in his home country [place].  He has lost weight over this.  He feels fine otherwise.  Has difficulty hearing.  Vision is good.  He is having no issues moving his bowels or difficulty with urination.  Past medical history of peripheral vascular disease renal lithiasis, tobacco abuse hypertension.  He has no history of diabetes.  Past Medical History:  Diagnosis Date   Aortic atherosclerosis (HCC)    per CT 08-29-209   Bilateral ureteral calculi    History of kidney stones    Nephrolithiasis    bilateral nonobstructive per CT 01-01-2018    Past Surgical History:  Procedure Laterality Date   CYSTOSCOPY W/ URETERAL STENT PLACEMENT Bilateral 01/01/2018   Procedure: CYSTOSCOPY WITH RETROGRADE PYELOGRAM/URETERAL STENT PLACEMENT;  Surgeon: Malen Gauze, MD;  Location: [facility];  Service: Urology;  Laterality: Bilateral;   CYSTOSCOPY WITH RETROGRADE PYELOGRAM, URETEROSCOPY AND STENT PLACEMENT Bilateral 01/20/2018   Procedure: CYSTOSCOPY WITH RETROGRADE PYELOGRAM, URETEROSCOPY AND STENT PLACEMENT;  Surgeon: Malen Gauze, MD;  Location: [facility];  Service: Urology;  Laterality: Bilateral;  1 HR  STENT EXCHANGE   URETEROLITHOTOMY  2007  approx.    History reviewed. No pertinent family history.  Social History   Socioeconomic History   Marital status: Married    Spouse name: Not on file   Number of children: Not on file    Years of education: Not on file   Highest education level: Not on file  Occupational History   Not on file  Tobacco Use   Smoking status: Some Days    Years: 10.00    Types: Cigarettes   Smokeless tobacco: Never   Tobacco comments:    01-16-2018  per pt 1ppmonth  Vaping Use   Vaping Use: Never used  Substance and Sexual Activity   Alcohol use: No   Drug use: Not Currently    Types: Marijuana    Comment: 01-16-2018  per pt occasional   Sexual activity: Not on file  Other Topics Concern   Not on file  Social History Narrative   Not on file   Social Determinants of Health   Financial Resource Strain: Not on file  Food Insecurity: Not on file  Transportation Needs: Not on file  Physical Activity: Not on file  Stress: Not on file  Social Connections: Not on file  Intimate Partner Violence: Not on file    Outpatient Medications Prior to Visit  Medication Sig Dispense Refill   acetaminophen (TYLENOL) 500 MG tablet Take 500 mg by mouth every 6 (six) hours as needed.     sulfamethoxazole-trimethoprim (BACTRIM DS,SEPTRA DS) 800-160 MG tablet Take 1 tablet by mouth 2 (two) times daily. 8 tablet 0   tamsulosin (FLOMAX) 0.4 MG CAPS capsule Take 1 capsule (0.4 mg total) by mouth daily after supper. 30 capsule 0   No facility-administered medications  prior to visit.    No Known Allergies  ROS Review of Systems  Constitutional:  Negative for diaphoresis, fatigue, fever and unexpected weight change.  HENT:  Positive for hearing loss.   Eyes:  Negative for photophobia.  Respiratory: Negative.    Cardiovascular: Negative.   Gastrointestinal:  Negative for abdominal pain, anal bleeding, blood in stool and constipation.  Endocrine: Negative for polyphagia and polyuria.  Genitourinary:  Negative for decreased urine volume, difficulty urinating, frequency and hematuria.  Musculoskeletal:  Negative for gait problem and joint swelling.  Neurological:  Negative for speech difficulty,  weakness and headaches.  Psychiatric/Behavioral:  Positive for dysphoric mood and sleep disturbance. The patient is nervous/anxious.      Objective:    Physical Exam Vitals and nursing note reviewed.  Constitutional:      General: He is not in acute distress.    Appearance: Normal appearance. He is not ill-appearing, toxic-appearing or diaphoretic.  HENT:     Head: Normocephalic and atraumatic.     Right Ear: Tympanic membrane, ear canal and external ear normal.     Left Ear: Tympanic membrane, ear canal and external ear normal.     Mouth/Throat:     Mouth: Mucous membranes are moist.     Pharynx: Oropharynx is clear. No oropharyngeal exudate or posterior oropharyngeal erythema.  Eyes:     Extraocular Movements: Extraocular movements intact.     Conjunctiva/sclera: Conjunctivae normal.     Pupils: Pupils are equal, round, and reactive to light.  Neck:     Vascular: No carotid bruit.  Cardiovascular:     Rate and Rhythm: Normal rate and regular rhythm.  Pulmonary:     Effort: Pulmonary effort is normal. No respiratory distress.     Breath sounds: Normal breath sounds. No stridor. No wheezing, rhonchi or rales.  Abdominal:     General: Abdomen is flat. Bowel sounds are normal. There is no distension.     Palpations: Abdomen is soft. There is no mass.     Tenderness: There is no abdominal tenderness. There is no guarding or rebound.     Hernia: No hernia is present.  Musculoskeletal:     Cervical back: No rigidity or tenderness.     Right lower leg: No edema.     Left lower leg: No edema.  Lymphadenopathy:     Cervical: No cervical adenopathy.  Skin:    General: Skin is warm and dry.  Neurological:     Mental Status: He is alert and oriented to person, place, and time.  Psychiatric:        Mood and Affect: Mood normal.        Behavior: Behavior normal.    BP (!) 142/86   Pulse 75   Temp (!) 97.2 F (36.2 C) (Temporal)   Ht 5\' 9"  (1.753 m)   Wt 153 lb (69.4 kg)    SpO2 98%   BMI 22.59 kg/m  Wt Readings from Last 3 Encounters:  11/29/20 153 lb (69.4 kg)  01/20/18 154 lb 1.6 oz (69.9 kg)  09/26/17 158 lb 3.2 oz (71.8 kg)     Health Maintenance Due  Topic Date Due   Hepatitis C Screening  Never done   TETANUS/TDAP  Never done   COLONOSCOPY (Pts 45-56yrs Insurance coverage will need to be confirmed)  Never done   Zoster Vaccines- Shingrix (1 of 2) Never done   PNA vac Low Risk Adult (1 of 2 - PCV13) Never done   COVID-19  Vaccine (3 - Booster for Pfizer series) 12/13/2019   INFLUENZA VACCINE  12/04/2020    There are no preventive care reminders to display for this patient.  Lab Results  Component Value Date   TSH 1.74 11/30/2020   Lab Results  Component Value Date   WBC 4.3 11/30/2020   HGB 15.9 11/30/2020   HCT 48.9 11/30/2020   MCV 91.5 11/30/2020   PLT 130.0 (L) 11/30/2020   Lab Results  Component Value Date   NA 138 11/30/2020   K 4.2 11/30/2020   CO2 25 11/30/2020   GLUCOSE 93 11/30/2020   BUN 16 11/30/2020   CREATININE 1.15 11/30/2020   BILITOT 0.9 11/30/2020   ALKPHOS 44 11/30/2020   AST 12 11/30/2020   ALT 12 11/30/2020   PROT 7.1 11/30/2020   ALBUMIN 4.5 11/30/2020   CALCIUM 9.4 11/30/2020   ANIONGAP 14 01/01/2018   GFR 66.00 11/30/2020   Lab Results  Component Value Date   CHOL 156 11/30/2020   Lab Results  Component Value Date   HDL 56.60 11/30/2020   Lab Results  Component Value Date   LDLCALC 88 11/30/2020   Lab Results  Component Value Date   TRIG 57.0 11/30/2020   Lab Results  Component Value Date   CHOLHDL 3 11/30/2020   Lab Results  Component Value Date   HGBA1C 6.1 11/30/2020      Assessment & Plan:   Problem List Items Addressed This Visit       Cardiovascular and Mediastinum   Essential hypertension - Primary   Relevant Orders   CBC (Completed)   Comprehensive metabolic panel (Completed)   Urinalysis, Routine w reflex microscopic (Completed)   PVD (peripheral vascular  disease) (HCC)   Relevant Orders   Comprehensive metabolic panel (Completed)   Lipid panel (Completed)     Nervous and Auditory   Presbycusis of both ears     Genitourinary   Asymptomatic microscopic hematuria   Relevant Orders   Ambulatory referral to Urology     Other   Depression with anxiety   Relevant Orders   TSH (Completed)   Elevated glucose   Relevant Orders   Comprehensive metabolic panel (Completed)   Hemoglobin A1c (Completed)   Healthcare maintenance   Relevant Orders   PSA (Completed)   Ambulatory referral to Gastroenterology   Tobacco use    Meds ordered this encounter  Medications   DISCONTD: mirtazapine (REMERON) 15 MG tablet    Sig: Take 1 tablet (15 mg total) by mouth at bedtime.    Dispense:  30 tablet    Refill:  2   DISCONTD: lisinopril (ZESTRIL) 10 MG tablet    Sig: Take 1 tablet (10 mg total) by mouth daily.    Dispense:  90 tablet    Refill:  3   : Return in about 1 month (around 12/30/2020).  Will return fasting tomorrow morning for blood work and in 1 month.  Advised to stop smoking.  Advised to go to [facility] and see the audiologist for hearing amplification.  Gave me permission to speak with Wayland Denis.   Mliss Sax, MD

## 2020-11-30 ENCOUNTER — Other Ambulatory Visit (INDEPENDENT_AMBULATORY_CARE_PROVIDER_SITE_OTHER): Payer: Medicare Other

## 2020-11-30 DIAGNOSIS — R7309 Other abnormal glucose: Secondary | ICD-10-CM

## 2020-11-30 DIAGNOSIS — Z0000 Encounter for general adult medical examination without abnormal findings: Secondary | ICD-10-CM | POA: Diagnosis not present

## 2020-11-30 DIAGNOSIS — I739 Peripheral vascular disease, unspecified: Secondary | ICD-10-CM

## 2020-11-30 DIAGNOSIS — I10 Essential (primary) hypertension: Secondary | ICD-10-CM | POA: Diagnosis not present

## 2020-11-30 DIAGNOSIS — F418 Other specified anxiety disorders: Secondary | ICD-10-CM | POA: Diagnosis not present

## 2020-11-30 LAB — COMPREHENSIVE METABOLIC PANEL
ALT: 12 U/L (ref 0–53)
AST: 12 U/L (ref 0–37)
Albumin: 4.5 g/dL (ref 3.5–5.2)
Alkaline Phosphatase: 44 U/L (ref 39–117)
BUN: 16 mg/dL (ref 6–23)
CO2: 25 mEq/L (ref 19–32)
Calcium: 9.4 mg/dL (ref 8.4–10.5)
Chloride: 104 mEq/L (ref 96–112)
Creatinine, Ser: 1.15 mg/dL (ref 0.40–1.50)
GFR: 66 mL/min (ref >60.00)
Glucose, Bld: 93 mg/dL (ref 70–99)
Potassium: 4.2 mEq/L (ref 3.5–5.1)
Sodium: 138 mEq/L (ref 135–145)
Total Bilirubin: 0.9 mg/dL (ref 0.2–1.2)
Total Protein: 7.1 g/dL (ref 6.0–8.3)

## 2020-11-30 LAB — PSA: PSA: 2.6 ng/mL (ref 0.10–4.00)

## 2020-11-30 LAB — LIPID PANEL
Cholesterol: 156 mg/dL (ref 0–200)
HDL: 56.6 mg/dL (ref >39.00)
LDL Cholesterol: 88 mg/dL (ref 0–99)
NonHDL: 99.26
Total CHOL/HDL Ratio: 3
Triglycerides: 57 mg/dL (ref 0.0–149.0)
VLDL: 11.4 mg/dL (ref 0.0–40.0)

## 2020-11-30 LAB — URINALYSIS, ROUTINE W REFLEX MICROSCOPIC
Bilirubin Urine: NEGATIVE
Ketones, ur: NEGATIVE
Leukocytes,Ua: NEGATIVE
Nitrite: NEGATIVE
Specific Gravity, Urine: 1.02 (ref 1.000–1.030)
Total Protein, Urine: NEGATIVE
Urine Glucose: NEGATIVE
Urobilinogen, UA: 0.2 (ref 0.0–1.0)
pH: 7 (ref 5.0–8.0)

## 2020-11-30 LAB — CBC
HCT: 48.9 % (ref 39.0–52.0)
Hemoglobin: 15.9 g/dL (ref 13.0–17.0)
MCHC: 32.5 g/dL (ref 30.0–36.0)
MCV: 91.5 fl (ref 78.0–100.0)
Platelets: 130 10*3/uL — ABNORMAL LOW (ref 150.0–400.0)
RBC: 5.34 Mil/uL (ref 4.22–5.81)
RDW: 14.4 % (ref 11.5–15.5)
WBC: 4.3 10*3/uL (ref 4.0–10.5)

## 2020-11-30 LAB — TSH: TSH: 1.74 u[IU]/mL (ref 0.35–5.50)

## 2020-11-30 LAB — HEMOGLOBIN A1C: Hgb A1c MFr Bld: 6.1 % (ref 4.6–6.5)

## 2020-12-05 DIAGNOSIS — R3121 Asymptomatic microscopic hematuria: Secondary | ICD-10-CM | POA: Insufficient documentation

## 2020-12-05 NOTE — Addendum Note (Signed)
Addended by: Andrez Grime on: 12/05/2020 04:06 PM   Modules accepted: Orders

## 2021-01-01 ENCOUNTER — Encounter: Payer: Self-pay | Admitting: Family Medicine

## 2021-01-01 ENCOUNTER — Ambulatory Visit (INDEPENDENT_AMBULATORY_CARE_PROVIDER_SITE_OTHER): Payer: Medicare Other | Admitting: Family Medicine

## 2021-01-01 ENCOUNTER — Other Ambulatory Visit: Payer: Self-pay

## 2021-01-01 VITALS — BP 134/86 | HR 75 | Temp 97.5°F | Ht 69.0 in | Wt 155.0 lb

## 2021-01-01 DIAGNOSIS — H9113 Presbycusis, bilateral: Secondary | ICD-10-CM | POA: Diagnosis not present

## 2021-01-01 DIAGNOSIS — R3121 Asymptomatic microscopic hematuria: Secondary | ICD-10-CM | POA: Diagnosis not present

## 2021-01-01 DIAGNOSIS — F418 Other specified anxiety disorders: Secondary | ICD-10-CM | POA: Diagnosis not present

## 2021-01-01 DIAGNOSIS — Z720 Tobacco use: Secondary | ICD-10-CM

## 2021-01-01 DIAGNOSIS — I10 Essential (primary) hypertension: Secondary | ICD-10-CM

## 2021-01-01 LAB — BASIC METABOLIC PANEL
BUN: 17 mg/dL (ref 6–23)
CO2: 26 mEq/L (ref 19–32)
Calcium: 9.3 mg/dL (ref 8.4–10.5)
Chloride: 104 mEq/L (ref 96–112)
Creatinine, Ser: 1.13 mg/dL (ref 0.40–1.50)
GFR: 67.37 mL/min (ref >60.00)
Glucose, Bld: 95 mg/dL (ref 70–99)
Potassium: 4 mEq/L (ref 3.5–5.1)
Sodium: 139 mEq/L (ref 135–145)

## 2021-01-01 LAB — URINALYSIS, ROUTINE W REFLEX MICROSCOPIC
Bilirubin Urine: NEGATIVE
Ketones, ur: NEGATIVE
Nitrite: NEGATIVE
Specific Gravity, Urine: 1.015 (ref 1.000–1.030)
Total Protein, Urine: NEGATIVE
Urine Glucose: NEGATIVE
Urobilinogen, UA: 0.2 (ref 0.0–1.0)
pH: 6 (ref 5.0–8.0)

## 2021-01-01 MED ORDER — MIRTAZAPINE 15 MG PO TABS: 15.0000 mg | ORAL_TABLET | Freq: Every day | ORAL | 2 refills | Status: DC

## 2021-01-01 MED ORDER — LISINOPRIL 20 MG PO TABS: 20.0000 mg | ORAL_TABLET | Freq: Every day | ORAL | 3 refills | Status: DC

## 2021-01-01 NOTE — Progress Notes (Signed)
Established Patient Office Visit  Subjective:  Patient ID: George Bishop, male    DOB: 1955-05-24  Age: 65 y.o. MRN: [id number]  CC:  Chief Complaint  Patient presents with   Follow-up    1 month follow up on BP, no concerns.     HPI George Bishop presents for follow-up of hypertension, sadness, hematuria, tobacco use.  Is not motivated to quit smoking at this time.  He has follow-up scheduled with urology October 2.  He is not certain if he wants to continue with the mirtazapine he is having no issues taking lisinopril including cough. 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  Past Medical History:  Diagnosis Date   Aortic atherosclerosis (HCC)    per CT 08-29-209   Bilateral ureteral calculi    History of kidney stones    Nephrolithiasis    bilateral nonobstructive per CT 01-01-2018    Past Surgical History:  Procedure Laterality Date   CYSTOSCOPY W/ URETERAL STENT PLACEMENT Bilateral 01/01/2018   Procedure: CYSTOSCOPY WITH RETROGRADE PYELOGRAM/URETERAL STENT PLACEMENT;  Surgeon: Malen Gauze, MD;  Location: [facility];  Service: Urology;  Laterality: Bilateral;   CYSTOSCOPY WITH RETROGRADE PYELOGRAM, URETEROSCOPY AND STENT PLACEMENT Bilateral 01/20/2018   Procedure: CYSTOSCOPY WITH RETROGRADE PYELOGRAM, URETEROSCOPY AND STENT PLACEMENT;  Surgeon: Malen Gauze, MD;  Location: [facility];  Service: Urology;  Laterality: Bilateral;  1 HR  STENT EXCHANGE   URETEROLITHOTOMY  2007  approx.    History reviewed. No pertinent family history.  Social History   Socioeconomic History   Marital status: Married    Spouse name: Not on file   Number of children: Not on file   Years of education: Not on file   Highest education level: Not on file  Occupational History   Not on file  Tobacco Use   Smoking status: Some Days    Years: 10.00    Types: Cigarettes   Smokeless tobacco: Never   Tobacco comments:    01-16-2018  per pt 1ppmonth  Vaping Use   Vaping  Use: Never used  Substance and Sexual Activity   Alcohol use: No   Drug use: Not Currently    Types: Marijuana    Comment: 01-16-2018  per pt occasional   Sexual activity: Not on file  Other Topics Concern   Not on file  Social History Narrative   Not on file   Social Determinants of Health   Financial Resource Strain: Not on file  Food Insecurity: Not on file  Transportation Needs: Not on file  Physical Activity: Not on file  Stress: Not on file  Social Connections: Not on file  Intimate Partner Violence: Not on file    Outpatient Medications Prior to Visit  Medication Sig Dispense Refill   lisinopril (ZESTRIL) 10 MG tablet Take 1 tablet (10 mg total) by mouth daily. 90 tablet 3   mirtazapine (REMERON) 15 MG tablet Take 1 tablet (15 mg total) by mouth at bedtime. 30 tablet 2   No facility-administered medications prior to visit.    No Known Allergies  ROS Review of Systems  Constitutional:  Negative for diaphoresis, fatigue, fever and unexpected weight change.  Eyes:  Negative for visual disturbance.  Respiratory:  Negative for cough and shortness of breath.   Cardiovascular: Negative.   Gastrointestinal: Negative.   Musculoskeletal:  Negative for gait problem and joint swelling.  Depression screen [facility] 2/9 01/01/2021 11/29/2020 11/29/2020  Decreased Interest 0 3 0  Down, Depressed, Hopeless 0  3 0  PHQ - 2 Score 0 6 0  Altered sleeping - 2 -  Tired, decreased energy - 3 -  Change in appetite - 1 -  Feeling bad or failure about yourself  - 0 -  Trouble concentrating - 1 -  Moving slowly or fidgety/restless - 1 -  Suicidal thoughts - 0 -  PHQ-9 Score - 14 -  Difficult doing work/chores - Somewhat difficult -       Objective:    Physical Exam Vitals and nursing note reviewed.  Constitutional:      General: He is not in acute distress.    Appearance: Normal appearance. He is normal weight. He is not ill-appearing, toxic-appearing or diaphoretic.  HENT:      Head: Normocephalic and atraumatic.     Right Ear: External ear normal.     Left Ear: External ear normal.  Eyes:     General: No scleral icterus.       Right eye: No discharge.        Left eye: No discharge.  Cardiovascular:     Rate and Rhythm: Normal rate and regular rhythm.  Pulmonary:     Effort: Pulmonary effort is normal.     Breath sounds: Normal breath sounds.  Skin:    General: Skin is warm and dry.  Neurological:     Mental Status: He is alert and oriented to person, place, and time.  Psychiatric:        Mood and Affect: Mood normal.        Behavior: Behavior normal.    BP 134/86 (BP Location: Right Arm, Patient Position: Sitting, Cuff Size: Normal)   Pulse 75   Temp (!) 97.5 F (36.4 C) (Temporal)   Ht 5\' 9"  (1.753 m)   Wt 155 lb (70.3 kg)   SpO2 97%   BMI 22.89 kg/m  Wt Readings from Last 3 Encounters:  01/01/21 155 lb (70.3 kg)  11/29/20 153 lb (69.4 kg)  01/20/18 154 lb 1.6 oz (69.9 kg)     Health Maintenance Due  Topic Date Due   Hepatitis C Screening  Never done   TETANUS/TDAP  Never done   COLONOSCOPY (Pts 45-48yrs Insurance coverage will need to be confirmed)  Never done   Zoster Vaccines- Shingrix (1 of 2) Never done   PNA vac Low Risk Adult (1 of 2 - PCV13) Never done   COVID-19 Vaccine (3 - Booster for Pfizer series) 12/13/2019   INFLUENZA VACCINE  12/04/2020    There are no preventive care reminders to display for this patient.  Lab Results  Component Value Date   TSH 1.74 11/30/2020   Lab Results  Component Value Date   WBC 4.3 11/30/2020   HGB 15.9 11/30/2020   HCT 48.9 11/30/2020   MCV 91.5 11/30/2020   PLT 130.0 (L) 11/30/2020   Lab Results  Component Value Date   NA 138 11/30/2020   K 4.2 11/30/2020   CO2 25 11/30/2020   GLUCOSE 93 11/30/2020   BUN 16 11/30/2020   CREATININE 1.15 11/30/2020   BILITOT 0.9 11/30/2020   ALKPHOS 44 11/30/2020   AST 12 11/30/2020   ALT 12 11/30/2020   PROT 7.1 11/30/2020   ALBUMIN 4.5  11/30/2020   CALCIUM 9.4 11/30/2020   ANIONGAP 14 01/01/2018   GFR 66.00 11/30/2020   Lab Results  Component Value Date   CHOL 156 11/30/2020   Lab Results  Component Value Date   HDL 56.60 11/30/2020  Lab Results  Component Value Date   LDLCALC 88 11/30/2020   Lab Results  Component Value Date   TRIG 57.0 11/30/2020   Lab Results  Component Value Date   CHOLHDL 3 11/30/2020   Lab Results  Component Value Date   HGBA1C 6.1 11/30/2020      Assessment & Plan:   Problem List Items Addressed This Visit       Cardiovascular and Mediastinum   Essential hypertension - Primary   Relevant Medications   lisinopril (ZESTRIL) 20 MG tablet   Other Relevant Orders   Basic metabolic panel     Nervous and Auditory   Presbycusis of both ears     Genitourinary   Asymptomatic microscopic hematuria   Relevant Orders   Urinalysis, Routine w reflex microscopic     Other   Depression with anxiety   Relevant Medications   mirtazapine (REMERON) 15 MG tablet   Tobacco use    Meds ordered this encounter  Medications   lisinopril (ZESTRIL) 20 MG tablet    Sig: Take 1 tablet (20 mg total) by mouth daily.    Dispense:  90 tablet    Refill:  3   mirtazapine (REMERON) 15 MG tablet    Sig: Take 1 tablet (15 mg total) by mouth at bedtime.    Dispense:  30 tablet    Refill:  2    Follow-up: Return in about 3 months (around 04/03/2021).  We discussed side effects of mirtazapine to include increased appetite with weight gain and somnolence.  Rechecking hematuria today.  Follow-up appointment with urology plan.  Blood pressure is looking better.  Diastolic remains a little elevated.  Have increase Zestril to 20 mg daily.  Encouraged him to stop smoking.  Mliss Sax, MD

## 2021-04-05 ENCOUNTER — Ambulatory Visit: Payer: Medicare Other | Admitting: Family Medicine

## 2021-04-10 ENCOUNTER — Encounter: Payer: Self-pay | Admitting: Family Medicine

## 2021-04-10 ENCOUNTER — Ambulatory Visit (INDEPENDENT_AMBULATORY_CARE_PROVIDER_SITE_OTHER): Payer: Medicare Other | Admitting: Family Medicine

## 2021-04-10 ENCOUNTER — Other Ambulatory Visit: Payer: Self-pay

## 2021-04-10 VITALS — BP 142/84 | HR 89 | Temp 97.1°F | Ht 69.0 in | Wt 153.8 lb

## 2021-04-10 DIAGNOSIS — Z1159 Encounter for screening for other viral diseases: Secondary | ICD-10-CM

## 2021-04-10 DIAGNOSIS — F5101 Primary insomnia: Secondary | ICD-10-CM

## 2021-04-10 DIAGNOSIS — I10 Essential (primary) hypertension: Secondary | ICD-10-CM

## 2021-04-10 DIAGNOSIS — Z720 Tobacco use: Secondary | ICD-10-CM

## 2021-04-10 DIAGNOSIS — Z23 Encounter for immunization: Secondary | ICD-10-CM

## 2021-04-10 DIAGNOSIS — R3121 Asymptomatic microscopic hematuria: Secondary | ICD-10-CM | POA: Diagnosis not present

## 2021-04-10 LAB — CBC
HCT: 47.3 % (ref 39.0–52.0)
Hemoglobin: 15.5 g/dL (ref 13.0–17.0)
MCHC: 32.7 g/dL (ref 30.0–36.0)
MCV: 91.5 fl (ref 78.0–100.0)
Platelets: 170 10*3/uL (ref 150.0–400.0)
RBC: 5.17 Mil/uL (ref 4.22–5.81)
RDW: 14.6 % (ref 11.5–15.5)
WBC: 4.2 10*3/uL (ref 4.0–10.5)

## 2021-04-10 LAB — BASIC METABOLIC PANEL
BUN: 22 mg/dL (ref 6–23)
CO2: 29 mEq/L (ref 19–32)
Calcium: 9.3 mg/dL (ref 8.4–10.5)
Chloride: 105 mEq/L (ref 96–112)
Creatinine, Ser: 1.36 mg/dL (ref 0.40–1.50)
GFR: 53.84 mL/min — ABNORMAL LOW (ref >60.00)
Glucose, Bld: 84 mg/dL (ref 70–99)
Potassium: 4 mEq/L (ref 3.5–5.1)
Sodium: 140 mEq/L (ref 135–145)

## 2021-04-10 LAB — URINALYSIS, ROUTINE W REFLEX MICROSCOPIC
Bilirubin Urine: NEGATIVE
Ketones, ur: NEGATIVE
Nitrite: NEGATIVE
Specific Gravity, Urine: 1.025 (ref 1.000–1.030)
Total Protein, Urine: NEGATIVE
Urine Glucose: NEGATIVE
Urobilinogen, UA: 0.2 (ref 0.0–1.0)
pH: 6 (ref 5.0–8.0)

## 2021-04-10 MED ORDER — LISINOPRIL 20 MG PO TABS
20.0000 mg | ORAL_TABLET | Freq: Every day | ORAL | 3 refills | Status: DC
Start: 2021-04-10 — End: 2022-02-28

## 2021-04-10 MED ORDER — TRAZODONE HCL 50 MG PO TABS: 25.0000 mg | ORAL_TABLET | Freq: Every evening | ORAL | 3 refills | Status: DC | PRN

## 2021-04-10 NOTE — Progress Notes (Signed)
Established Patient Office Visit  Subjective:  Patient ID: George Bishop, male    DOB: 01-19-1956  Age: 65 y.o. MRN: [id number]  CC:  Chief Complaint  Patient presents with   Follow-up    3 month follow up, no concerns.     HPI George Bishop presents for follow-up of hypertension, microhematuria, tobacco use, and insomnia.  He did not tolerate Remeron well.  Cause drowsiness into the next day and he stopped it.  No longer feels depressed but just has difficulty sleeping.  He would like to try something else.  Blood pressure has been well controlled with the lisinopril he is taking it every day.  Has not had follow-up with urology for his hematuria.  He denies any issues at all with his urine flow and cannot understand why he needs to see a urologist.  He is smoking 1 or 2 cigarettes daily he request that we mail him information about an appointment.  Text messages and emails confusing.  Agrees to have a flu vaccine today.  Burmese translation services were used for this encounter.  He does not drink alcohol at all.  Past Medical History:  Diagnosis Date   Aortic atherosclerosis (HCC)    per CT 08-29-209   Bilateral ureteral calculi    History of kidney stones    Nephrolithiasis    bilateral nonobstructive per CT 01-01-2018    Past Surgical History:  Procedure Laterality Date   CYSTOSCOPY W/ URETERAL STENT PLACEMENT Bilateral 01/01/2018   Procedure: CYSTOSCOPY WITH RETROGRADE PYELOGRAM/URETERAL STENT PLACEMENT;  Surgeon: Malen Gauze, MD;  Location: [facility];  Service: Urology;  Laterality: Bilateral;   CYSTOSCOPY WITH RETROGRADE PYELOGRAM, URETEROSCOPY AND STENT PLACEMENT Bilateral 01/20/2018   Procedure: CYSTOSCOPY WITH RETROGRADE PYELOGRAM, URETEROSCOPY AND STENT PLACEMENT;  Surgeon: Malen Gauze, MD;  Location[facility];  Service: Urology;  Laterality: Bilateral;  1 HR  STENT EXCHANGE   URETEROLITHOTOMY  2007  approx.    No family history on  file.  Social History   Socioeconomic History   Marital status: Married    Spouse name: Not on file   Number of children: Not on file   Years of education: Not on file   Highest education level: Not on file  Occupational History   Not on file  Tobacco Use   Smoking status: Some Days    Years: 10.00    Types: Cigarettes   Smokeless tobacco: Never   Tobacco comments:    01-16-2018  per pt 1ppmonth  Vaping Use   Vaping Use: Never used  Substance and Sexual Activity   Alcohol use: No   Drug use: Not Currently    Types: Marijuana    Comment: 01-16-2018  per pt occasional   Sexual activity: Not on file  Other Topics Concern   Not on file  Social History Narrative   Not on file   Social Determinants of Health   Financial Resource Strain: Not on file  Food Insecurity: Not on file  Transportation Needs: Not on file  Physical Activity: Not on file  Stress: Not on file  Social Connections: Not on file  Intimate Partner Violence: Not on file    Outpatient Medications Prior to Visit  Medication Sig Dispense Refill   lisinopril (ZESTRIL) 20 MG tablet Take 1 tablet (20 mg total) by mouth daily. 90 tablet 3   mirtazapine (REMERON) 15 MG tablet Take 1 tablet (15 mg total) by mouth at bedtime. (Patient not taking:  Reported on 04/10/2021) 30 tablet 2   No facility-administered medications prior to visit.    No Known Allergies  ROS Review of Systems  Constitutional:  Negative for chills, diaphoresis, fatigue, fever and unexpected weight change.  HENT: Negative.    Eyes:  Negative for photophobia and visual disturbance.  Respiratory: Negative.    Cardiovascular: Negative.   Gastrointestinal: Negative.   Endocrine: Negative for polyphagia and polyuria.  Genitourinary:  Negative for decreased urine volume, difficulty urinating, frequency, hematuria and urgency.  Musculoskeletal:  Negative for gait problem.  Neurological:  Negative for weakness and numbness.   Psychiatric/Behavioral:  Positive for sleep disturbance. Negative for dysphoric mood. The patient is not nervous/anxious.      Objective:    Physical Exam  BP (!) 142/84 (BP Location: Right Arm, Patient Position: Sitting, Cuff Size: Normal)   Pulse 89   Temp (!) 97.1 F (36.2 C) (Temporal)   Ht 5\' 9"  (1.753 m)   Wt 153 lb 12.8 oz (69.8 kg)   SpO2 97%   BMI 22.71 kg/m  Wt Readings from Last 3 Encounters:  04/10/21 153 lb 12.8 oz (69.8 kg)  01/01/21 155 lb (70.3 kg)  11/29/20 153 lb (69.4 kg)     Health Maintenance Due  Topic Date Due   Pneumonia Vaccine 74+ Years old (1 - PCV) Never done   Hepatitis C Screening  Never done   TETANUS/TDAP  Never done   COLONOSCOPY (Pts 45-49yrs Insurance coverage will need to be confirmed)  Never done   Zoster Vaccines- Shingrix (1 of 2) Never done   COVID-19 Vaccine (3 - Booster for [place] series) 09/07/2019    There are no preventive care reminders to display for this patient.  Lab Results  Component Value Date   TSH 1.74 11/30/2020   Lab Results  Component Value Date   WBC 4.3 11/30/2020   HGB 15.9 11/30/2020   HCT 48.9 11/30/2020   MCV 91.5 11/30/2020   PLT 130.0 (L) 11/30/2020   Lab Results  Component Value Date   NA 139 01/01/2021   K 4.0 01/01/2021   CO2 26 01/01/2021   GLUCOSE 95 01/01/2021   BUN 17 01/01/2021   CREATININE 1.13 01/01/2021   BILITOT 0.9 11/30/2020   ALKPHOS 44 11/30/2020   AST 12 11/30/2020   ALT 12 11/30/2020   PROT 7.1 11/30/2020   ALBUMIN 4.5 11/30/2020   CALCIUM 9.3 01/01/2021   ANIONGAP 14 01/01/2018   GFR 67.37 01/01/2021   Lab Results  Component Value Date   CHOL 156 11/30/2020   Lab Results  Component Value Date   HDL 56.60 11/30/2020   Lab Results  Component Value Date   LDLCALC 88 11/30/2020   Lab Results  Component Value Date   TRIG 57.0 11/30/2020   Lab Results  Component Value Date   CHOLHDL 3 11/30/2020   Lab Results  Component Value Date   HGBA1C 6.1  11/30/2020      Assessment & Plan:   Problem List Items Addressed This Visit       Cardiovascular and Mediastinum   Essential hypertension   Relevant Medications   lisinopril (ZESTRIL) 20 MG tablet     Genitourinary   Asymptomatic microscopic hematuria   Relevant Orders   Urinalysis, Routine w reflex microscopic   Ambulatory referral to Urology     Other   Tobacco use   Primary insomnia   Relevant Medications   traZODone (DESYREL) 50 MG tablet   Need for hepatitis C  screening test   Need for influenza vaccination - Primary   Relevant Orders   Flu Vaccine QUAD High Dose(Fluad) (Completed)   CBC   Basic metabolic panel    Meds ordered this encounter  Medications   traZODone (DESYREL) 50 MG tablet    Sig: Take 0.5-1 tablets (25-50 mg total) by mouth at bedtime as needed for sleep.    Dispense:  30 tablet    Refill:  3   lisinopril (ZESTRIL) 20 MG tablet    Sig: Take 1 tablet (20 mg total) by mouth daily.    Dispense:  90 tablet    Refill:  3    Follow-up: Return in about 6 months (around 10/09/2021), or if symptoms worsen or fail to improve.   Asked him to quit smoking 1 or 2 cigarettes that he does smoke daily.  He is smoking marijuana on occasion.  Dressing in place pends a follow-up with somebody with hematuria or who also has a tobacco history.  Continue lisinopril Mliss Sax, MD

## 2021-10-11 ENCOUNTER — Telehealth: Payer: Self-pay | Admitting: Family Medicine

## 2021-10-11 ENCOUNTER — Ambulatory Visit: Payer: Medicare Other | Admitting: Family Medicine

## 2021-10-11 NOTE — Telephone Encounter (Signed)
Pt was a no show for an OV with Dr. Doreene Burke on 10/11/21, I sent a no show letter.

## 2021-11-20 NOTE — Telephone Encounter (Signed)
1st no show, fee waived ?

## 2022-02-28 ENCOUNTER — Ambulatory Visit (INDEPENDENT_AMBULATORY_CARE_PROVIDER_SITE_OTHER): Payer: Medicare Other | Admitting: Family Medicine

## 2022-02-28 ENCOUNTER — Encounter: Payer: Self-pay | Admitting: Family Medicine

## 2022-02-28 VITALS — BP 136/90 | HR 78 | Temp 97.3°F | Ht 69.0 in | Wt 158.8 lb

## 2022-02-28 DIAGNOSIS — I10 Essential (primary) hypertension: Secondary | ICD-10-CM | POA: Diagnosis not present

## 2022-02-28 DIAGNOSIS — Z23 Encounter for immunization: Secondary | ICD-10-CM | POA: Diagnosis not present

## 2022-02-28 DIAGNOSIS — J301 Allergic rhinitis due to pollen: Secondary | ICD-10-CM

## 2022-02-28 DIAGNOSIS — F5101 Primary insomnia: Secondary | ICD-10-CM

## 2022-02-28 DIAGNOSIS — Z720 Tobacco use: Secondary | ICD-10-CM | POA: Diagnosis not present

## 2022-02-28 MED ORDER — FLUTICASONE PROPIONATE 50 MCG/ACT NA SUSP: 1.0000 | Freq: Every day | NASAL | 1 refills | Status: DC

## 2022-02-28 MED ORDER — LISINOPRIL 40 MG PO TABS: 40.0000 mg | ORAL_TABLET | Freq: Every day | ORAL | 3 refills | Status: DC

## 2022-02-28 NOTE — Progress Notes (Signed)
Established Patient Office Visit  Subjective   Patient ID: George Bishop, male    DOB: 08/05/1955  Age: 66 y.o. MRN: [id number]  Chief Complaint  Patient presents with   Follow-up    Follow up on BP and medications.     HPI follow-up of hypertension, allergy rhinitis, worry and insomnia.  Never tried the trazodone secondary to concern about side effects.   continues to worry a lot not sleep well.  Denies depression.  Was reluctant to take trazodone because he worried about side effects.  Continues with lisinopril 20 mg for blood pressure.  Tolerating the medicine well.  He is taking it daily.  Reports itchy eyes and sneezing.  He has experienced this this time of year.  He has never had an eye check.  Vision seems to be okay.  Continues to smoke a cigarette or 2 daily.    Review of Systems  Constitutional: Negative.   HENT: Negative.    Eyes:  Negative for blurred vision, discharge and redness.  Respiratory: Negative.  Negative for cough.   Cardiovascular: Negative.   Gastrointestinal:  Negative for abdominal pain.  Genitourinary: Negative.   Musculoskeletal: Negative.  Negative for myalgias.  Skin:  Negative for rash.  Neurological:  Negative for tingling, loss of consciousness and weakness.  Endo/Heme/Allergies:  Negative for polydipsia.  Psychiatric/Behavioral:  The patient is nervous/anxious and has insomnia.       02/28/2022    1:47 PM 04/10/2021   10:36 AM 01/01/2021   10:10 AM  Depression screen PHQ 2/9  Decreased Interest 0 0 0  Down, Depressed, Hopeless 0 0 0  PHQ - 2 Score 0 0 0       Objective:     BP (!) 136/90 (BP Location: Right Arm, Patient Position: Sitting, Cuff Size: Normal)   Pulse 78   Temp (!) 97.3 F (36.3 C) (Temporal)   Ht 5\' 9"  (1.753 m)   Wt 158 lb 12.8 oz (72 kg)   SpO2 97%   BMI 23.45 kg/m    Physical Exam Constitutional:      General: He is not in acute distress.    Appearance: Normal appearance. He is not ill-appearing,  toxic-appearing or diaphoretic.  HENT:     Head: Normocephalic and atraumatic.     Right Ear: External ear normal.     Left Ear: External ear normal.     Mouth/Throat:     Mouth: Mucous membranes are moist.     Pharynx: Oropharynx is clear. No oropharyngeal exudate or posterior oropharyngeal erythema.  Eyes:     General: No scleral icterus.       Right eye: No discharge.        Left eye: No discharge.     Extraocular Movements: Extraocular movements intact.     Conjunctiva/sclera: Conjunctivae normal.     Pupils: Pupils are equal, round, and reactive to light.  Cardiovascular:     Rate and Rhythm: Normal rate and regular rhythm.  Pulmonary:     Effort: Pulmonary effort is normal. No respiratory distress.     Breath sounds: Normal breath sounds.  Musculoskeletal:     Cervical back: No rigidity or tenderness.  Skin:    General: Skin is warm and dry.  Neurological:     Mental Status: He is alert and oriented to person, place, and time.  Psychiatric:        Mood and Affect: Mood normal.        Behavior: Behavior  normal.      No results found for any visits on 02/28/22.    The 10-year ASCVD risk score (Arnett DK, et al., 2019) is: 21%    Assessment & Plan:   Problem List Items Addressed This Visit       Cardiovascular and Mediastinum   Essential hypertension - Primary   Relevant Medications   lisinopril (ZESTRIL) 40 MG tablet   Other Relevant Orders   Basic metabolic panel     Respiratory   Seasonal allergic rhinitis due to pollen   Relevant Medications   fluticasone (FLONASE) 50 MCG/ACT nasal spray     Other   Tobacco use   Primary insomnia   Need for influenza vaccination   Relevant Orders   Flu vaccine HIGH DOSE PF (Fluzone High dose)    Return in about 3 months (around 05/31/2022), or See optometrist for eye check..  Burmese translator used.  Advised to have COVID booster and RSV vaccines.  Advised patient to see optometrist for eye check.  We will  start Flonase for allergy rhinitis.  Was advised that he needs to use it consistently.  Have increased lisinopril to 40 mg daily.  Encouraged him to exercise by walking.  He will let me know if and when he might like to try medication for his constant worry and insomnia..  Advised patient to stop smoking.  Libby Maw, MD

## 2022-03-01 LAB — BASIC METABOLIC PANEL
BUN: 10 mg/dL (ref 6–23)
CO2: 28 mEq/L (ref 19–32)
Calcium: 9.4 mg/dL (ref 8.4–10.5)
Chloride: 102 mEq/L (ref 96–112)
Creatinine, Ser: 1.04 mg/dL (ref 0.40–1.50)
GFR: 73.82 mL/min (ref >60.00)
Glucose, Bld: 76 mg/dL (ref 70–99)
Potassium: 4.3 mEq/L (ref 3.5–5.1)
Sodium: 139 mEq/L (ref 135–145)

## 2022-03-18 ENCOUNTER — Encounter: Payer: Self-pay | Admitting: Family Medicine

## 2022-05-28 ENCOUNTER — Telehealth: Payer: Self-pay | Admitting: Family Medicine

## 2022-05-28 NOTE — Telephone Encounter (Signed)
Tried calling patient to schedule Medicare Annual Wellness Visit (AWV) either virtually or in office.  my jabber number [phone]  No answer    *due 09/04/2019 awviPER PALMETTO please schedule with Nurse Health Adviser   45 min for awv-i  in office appointments 30 min for awv-s & awv-i phone/virtual appointments

## 2022-08-27 ENCOUNTER — Telehealth: Payer: Self-pay | Admitting: Family Medicine

## 2022-08-27 NOTE — Telephone Encounter (Signed)
Contacted George Bishop to schedule their annual wellness visit. Appointment made for 09/02/22.  Rudell Cobb AWV direct phone # [phone]

## 2022-09-02 ENCOUNTER — Ambulatory Visit (INDEPENDENT_AMBULATORY_CARE_PROVIDER_SITE_OTHER): Payer: Medicare Other

## 2022-09-02 VITALS — BP 130/80 | HR 105 | Temp 98.3°F | Ht 69.0 in | Wt 159.0 lb

## 2022-09-02 DIAGNOSIS — Z0000 Encounter for general adult medical examination without abnormal findings: Secondary | ICD-10-CM
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
## 2022-09-02 NOTE — Patient Instructions (Signed)
George Bishop , Thank you for taking time to come for your Medicare Wellness Visit. I appreciate your ongoing commitment to your health goals. Please review the following plan we discussed and let me know if I can assist you in the future.   These are the goals we discussed:  Goals      Patient Stated     09/02/2022, wants to be healthy        This is a list of the screening recommended for you and due dates:  Health Maintenance  Topic Date Due   Hepatitis C Screening: USPSTF Recommendation to screen - Ages 72-79 yo.  Never done   DTaP/Tdap/Td vaccine (1 - Tdap) Never done   Flu Shot  12/05/2022   Medicare Annual Wellness Visit  09/02/2023   HPV Vaccine  Aged Out   Pneumonia Vaccine  Discontinued   Colon Cancer Screening  Discontinued   COVID-19 Vaccine  Discontinued   Zoster (Shingles) Vaccine  Discontinued    Advanced directives: Advance directive discussed with you today. Even though you declined this today please call our office should you change your mind and we can give you the proper paperwork for you to fill out.  Conditions/risks identified: smoking  Next appointment: Follow up in one year for your annual wellness visit.   Preventive Care 68 Years and Older, Male  Preventive care refers to lifestyle choices and visits with your health care provider that can promote health and wellness. What does preventive care include? A yearly physical exam. This is also called an annual well check. Dental exams once or twice a year. Routine eye exams. Ask your health care provider how often you should have your eyes checked. Personal lifestyle choices, including: Daily care of your teeth and gums. Regular physical activity. Eating a healthy diet. Avoiding tobacco and drug use. Limiting alcohol use. Practicing safe sex. Taking low doses of aspirin every day. Taking vitamin and mineral supplements as recommended by your health care provider. What happens during an annual well  check? The services and screenings done by your health care provider during your annual well check will depend on your age, overall health, lifestyle risk factors, and family history of disease. Counseling  Your health care provider may ask you questions about your: Alcohol use. Tobacco use. Drug use. Emotional well-being. Home and relationship well-being. Sexual activity. Eating habits. History of falls. Memory and ability to understand (cognition). Work and work Astronomer. Screening  You may have the following tests or measurements: Height, weight, and BMI. Blood pressure. Lipid and cholesterol levels. These may be checked every 5 years, or more frequently if you are over 67 years old. Skin check. Lung cancer screening. You may have this screening every year starting at age 64 if you have a 30-pack-year history of smoking and currently smoke or have quit within the past 15 years. Fecal occult blood test (FOBT) of the stool. You may have this test every year starting at age 40. Flexible sigmoidoscopy or colonoscopy. You may have a sigmoidoscopy every 5 years or a colonoscopy every 10 years starting at age 49. Prostate cancer screening. Recommendations will vary depending on your family history and other risks. Hepatitis C blood test. Hepatitis B blood test. Sexually transmitted disease (STD) testing. Diabetes screening. This is done by checking your blood sugar (glucose) after you have not eaten for a while (fasting). You may have this done every 1-3 years. Abdominal aortic aneurysm (AAA) screening. You may need this if you are  a current or former smoker. Osteoporosis. You may be screened starting at age 55 if you are at high risk. Talk with your health care provider about your test results, treatment options, and if necessary, the need for more tests. Vaccines  Your health care provider may recommend certain vaccines, such as: Influenza vaccine. This is recommended every  year. Tetanus, diphtheria, and acellular pertussis (Tdap, Td) vaccine. You may need a Td booster every 10 years. Zoster vaccine. You may need this after age 39. Pneumococcal 13-valent conjugate (PCV13) vaccine. One dose is recommended after age 79. Pneumococcal polysaccharide (PPSV23) vaccine. One dose is recommended after age 26. Talk to your health care provider about which screenings and vaccines you need and how often you need them. This information is not intended to replace advice given to you by your health care provider. Make sure you discuss any questions you have with your health care provider. Document Released: 05/19/2015 Document Revised: 01/10/2016 Document Reviewed: 02/21/2015 Elsevier Interactive Patient Education  2017 [employer] Prevention in the Home Falls can cause injuries. They can happen to people of all ages. There are many things you can do to make your home safe and to help prevent falls. What can I do on the outside of my home? Regularly fix the edges of walkways and driveways and fix any cracks. Remove anything that might make you trip as you walk through a door, such as a raised step or threshold. Trim any bushes or trees on the path to your home. Use bright outdoor lighting. Clear any walking paths of anything that might make someone trip, such as rocks or tools. Regularly check to see if handrails are loose or broken. Make sure that both sides of any steps have handrails. Any raised decks and porches should have guardrails on the edges. Have any leaves, snow, or ice cleared regularly. Use sand or salt on walking paths during winter. Clean up any spills in your garage right away. This includes oil or grease spills. What can I do in the bathroom? Use night lights. Install grab bars by the toilet and in the tub and shower. Do not use towel bars as grab bars. Use non-skid mats or decals in the tub or shower. If you need to sit down in the shower, use a  plastic, non-slip stool. Keep the floor dry. Clean up any water that spills on the floor as soon as it happens. Remove soap buildup in the tub or shower regularly. Attach bath mats securely with double-sided non-slip rug tape. Do not have throw rugs and other things on the floor that can make you trip. What can I do in the bedroom? Use night lights. Make sure that you have a light by your bed that is easy to reach. Do not use any sheets or blankets that are too big for your bed. They should not hang down onto the floor. Have a firm chair that has side arms. You can use this for support while you get dressed. Do not have throw rugs and other things on the floor that can make you trip. What can I do in the kitchen? Clean up any spills right away. Avoid walking on wet floors. Keep items that you use a lot in easy-to-reach places. If you need to reach something above you, use a strong step stool that has a grab bar. Keep electrical cords out of the way. Do not use floor polish or wax that makes floors slippery. If you must  use wax, use non-skid floor wax. Do not have throw rugs and other things on the floor that can make you trip. What can I do with my stairs? Do not leave any items on the stairs. Make sure that there are handrails on both sides of the stairs and use them. Fix handrails that are broken or loose. Make sure that handrails are as long as the stairways. Check any carpeting to make sure that it is firmly attached to the stairs. Fix any carpet that is loose or worn. Avoid having throw rugs at the top or bottom of the stairs. If you do have throw rugs, attach them to the floor with carpet tape. Make sure that you have a light switch at the top of the stairs and the bottom of the stairs. If you do not have them, ask someone to add them for you. What else can I do to help prevent falls? Wear shoes that: Do not have high heels. Have rubber bottoms. Are comfortable and fit you  well. Are closed at the toe. Do not wear sandals. If you use a stepladder: Make sure that it is fully opened. Do not climb a closed stepladder. Make sure that both sides of the stepladder are locked into place. Ask someone to hold it for you, if possible. Clearly mark and make sure that you can see: Any grab bars or handrails. First and last steps. Where the edge of each step is. Use tools that help you move around (mobility aids) if they are needed. These include: Canes. Walkers. Scooters. Crutches. Turn on the lights when you go into a dark area. Replace any light bulbs as soon as they burn out. Set up your furniture so you have a clear path. Avoid moving your furniture around. If any of your floors are uneven, fix them. If there are any pets around you, be aware of where they are. Review your medicines with your doctor. Some medicines can make you feel dizzy. This can increase your chance of falling. Ask your doctor what other things that you can do to help prevent falls. This information is not intended to replace advice given to you by your health care provider. Make sure you discuss any questions you have with your health care provider. Document Released: 02/16/2009 Document Revised: 09/28/2015 Document Reviewed: 05/27/2014 Elsevier Interactive Patient Education  2017 [employer].

## 2022-09-05 ENCOUNTER — Ambulatory Visit (INDEPENDENT_AMBULATORY_CARE_PROVIDER_SITE_OTHER): Payer: Medicare Other | Admitting: Family Medicine

## 2022-09-05 ENCOUNTER — Encounter: Payer: Self-pay | Admitting: Family Medicine

## 2022-09-05 VITALS — BP 146/88 | HR 81 | Temp 97.9°F | Ht 69.0 in | Wt 159.2 lb

## 2022-09-05 DIAGNOSIS — S161XXA Strain of muscle, fascia and tendon at neck level, initial encounter: Secondary | ICD-10-CM

## 2022-09-05 DIAGNOSIS — H9113 Presbycusis, bilateral: Secondary | ICD-10-CM

## 2022-09-05 DIAGNOSIS — S46912A Strain of unspecified muscle, fascia and tendon at shoulder and upper arm level, left arm, initial encounter: Secondary | ICD-10-CM | POA: Diagnosis not present

## 2022-09-05 DIAGNOSIS — I10 Essential (primary) hypertension: Secondary | ICD-10-CM | POA: Diagnosis not present

## 2022-09-05 DIAGNOSIS — Z758 Other problems related to medical facilities and other health care: Secondary | ICD-10-CM

## 2022-09-05 DIAGNOSIS — Z603 Acculturation difficulty: Secondary | ICD-10-CM | POA: Diagnosis not present

## 2022-09-05 MED ORDER — METHOCARBAMOL 500 MG PO TABS
500.0000 mg | ORAL_TABLET | Freq: Three times a day (TID) | ORAL | 0 refills | Status: DC | PRN
Start: 2022-09-05 — End: 2024-01-20

## 2022-09-05 MED ORDER — LISINOPRIL 40 MG PO TABS: 40.0000 mg | ORAL_TABLET | Freq: Every day | ORAL | 3 refills | Status: DC
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
## 2022-09-10 ENCOUNTER — Ambulatory Visit: Payer: Medicare Other | Admitting: Family Medicine

## 2022-09-10 ENCOUNTER — Ambulatory Visit (INDEPENDENT_AMBULATORY_CARE_PROVIDER_SITE_OTHER): Payer: Medicare Other

## 2022-09-10 VITALS — BP 152/92 | HR 108 | Ht 69.0 in | Wt 159.0 lb

## 2022-09-10 DIAGNOSIS — M5412 Radiculopathy, cervical region: Secondary | ICD-10-CM

## 2022-09-10 DIAGNOSIS — M542 Cervicalgia: Secondary | ICD-10-CM

## 2022-09-10 MED ORDER — PREDNISONE 50 MG PO TABS: 50.0000 mg | ORAL_TABLET | Freq: Every day | ORAL | 0 refills | Status: DC
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
## 2022-09-10 NOTE — Patient Instructions (Addendum)
Thank you for coming in today.   Please get an Xray today before you leave   I've referred you to Physical Therapy.  Let us know if you don't hear from them in one week.   Take the prednisone medicine for 5 days.   Recheck in 1 month.
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
## 2022-09-16 NOTE — Progress Notes (Signed)
Cervical spine x-ray shows some arthritis.

## 2022-09-17 ENCOUNTER — Encounter: Payer: Self-pay | Admitting: Rehabilitative and Restorative Service Providers"

## 2022-09-17 ENCOUNTER — Telehealth: Payer: Self-pay

## 2022-09-17 ENCOUNTER — Ambulatory Visit: Payer: Medicare Other | Admitting: Rehabilitative and Restorative Service Providers"

## 2022-09-17 ENCOUNTER — Other Ambulatory Visit: Payer: Self-pay

## 2022-09-17 DIAGNOSIS — M542 Cervicalgia: Secondary | ICD-10-CM | POA: Diagnosis not present

## 2022-09-17 DIAGNOSIS — M6281 Muscle weakness (generalized): Secondary | ICD-10-CM | POA: Diagnosis not present

## 2022-09-17 DIAGNOSIS — M79602 Pain in left arm: Secondary | ICD-10-CM | POA: Diagnosis not present

## 2022-09-17 NOTE — Telephone Encounter (Signed)
Patient returned voicemail, but I was unable to clearly communicate with them so the call was ended and then resumed once we could connect with the interpreter line.  He was provided Dr. Zollie Pee result note and treatment plan.  He verbalized understanding.  Agreed to previously scheduled follow-up visit.

## 2022-10-01 ENCOUNTER — Ambulatory Visit (INDEPENDENT_AMBULATORY_CARE_PROVIDER_SITE_OTHER): Payer: Medicare Other | Admitting: Physical Therapy

## 2022-10-01 ENCOUNTER — Encounter: Payer: Self-pay | Admitting: Physical Therapy

## 2022-10-01 DIAGNOSIS — M6281 Muscle weakness (generalized): Secondary | ICD-10-CM

## 2022-10-01 DIAGNOSIS — M542 Cervicalgia: Secondary | ICD-10-CM

## 2022-10-01 DIAGNOSIS — M79602 Pain in left arm: Secondary | ICD-10-CM
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
## 2022-10-02 ENCOUNTER — Encounter: Payer: Self-pay | Admitting: Physical Therapy

## 2022-10-02 ENCOUNTER — Ambulatory Visit: Payer: Medicare Other | Admitting: Physical Therapy

## 2022-10-02 DIAGNOSIS — M542 Cervicalgia: Secondary | ICD-10-CM

## 2022-10-02 DIAGNOSIS — M79602 Pain in left arm: Secondary | ICD-10-CM

## 2022-10-02 DIAGNOSIS — M6281 Muscle weakness (generalized): Secondary | ICD-10-CM
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
## 2022-10-07 ENCOUNTER — Ambulatory Visit: Payer: Medicare Other | Admitting: Physical Therapy

## 2022-10-07 ENCOUNTER — Encounter: Payer: Self-pay | Admitting: Physical Therapy

## 2022-10-07 DIAGNOSIS — M79602 Pain in left arm: Secondary | ICD-10-CM | POA: Diagnosis not present

## 2022-10-07 DIAGNOSIS — M542 Cervicalgia: Secondary | ICD-10-CM

## 2022-10-07 DIAGNOSIS — M6281 Muscle weakness (generalized): Secondary | ICD-10-CM | POA: Diagnosis not present
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
## 2022-10-09 ENCOUNTER — Encounter: Payer: Self-pay | Admitting: Rehabilitative and Restorative Service Providers"

## 2022-10-09 ENCOUNTER — Ambulatory Visit: Payer: Medicare Other | Admitting: Rehabilitative and Restorative Service Providers"

## 2022-10-09 DIAGNOSIS — M79602 Pain in left arm: Secondary | ICD-10-CM

## 2022-10-09 DIAGNOSIS — M542 Cervicalgia: Secondary | ICD-10-CM

## 2022-10-09 DIAGNOSIS — M6281 Muscle weakness (generalized): Secondary | ICD-10-CM | POA: Diagnosis not present
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
## 2022-10-10 ENCOUNTER — Ambulatory Visit: Payer: Medicare Other | Admitting: Family Medicine

## 2022-10-10 VITALS — BP 130/82 | HR 76 | Ht 66.0 in | Wt 159.0 lb

## 2022-10-10 DIAGNOSIS — M5412 Radiculopathy, cervical region: Secondary | ICD-10-CM | POA: Diagnosis not present
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
## 2022-10-10 NOTE — Patient Instructions (Addendum)
Thank you for coming in today.   Recheck as needed.   Consider a heating pad.

## 2022-10-18 ENCOUNTER — Ambulatory Visit (INDEPENDENT_AMBULATORY_CARE_PROVIDER_SITE_OTHER): Payer: Medicare Other | Admitting: Family Medicine

## 2022-10-18 ENCOUNTER — Encounter: Payer: Self-pay | Admitting: Family Medicine

## 2022-10-18 VITALS — BP 118/78 | HR 94 | Temp 98.1°F | Ht 66.0 in | Wt 161.6 lb

## 2022-10-18 DIAGNOSIS — H9113 Presbycusis, bilateral: Secondary | ICD-10-CM

## 2022-10-18 DIAGNOSIS — Z720 Tobacco use: Secondary | ICD-10-CM | POA: Diagnosis not present

## 2022-10-18 DIAGNOSIS — I10 Essential (primary) hypertension: Secondary | ICD-10-CM | POA: Diagnosis not present

## 2022-10-18 DIAGNOSIS — Z0000 Encounter for general adult medical examination without abnormal findings: Secondary | ICD-10-CM

## 2022-10-18 DIAGNOSIS — R7309 Other abnormal glucose: Secondary | ICD-10-CM

## 2022-10-18 DIAGNOSIS — Z125 Encounter for screening for malignant neoplasm of prostate: Secondary | ICD-10-CM | POA: Diagnosis not present

## 2022-10-18 DIAGNOSIS — Z0001 Encounter for general adult medical examination with abnormal findings: Secondary | ICD-10-CM | POA: Diagnosis not present

## 2022-10-18 LAB — URINALYSIS, ROUTINE W REFLEX MICROSCOPIC
Bilirubin Urine: NEGATIVE
Ketones, ur: NEGATIVE
Nitrite: NEGATIVE
Specific Gravity, Urine: <=1.005 — AB (ref 1.000–1.030)
Total Protein, Urine: NEGATIVE
Urine Glucose: NEGATIVE
Urobilinogen, UA: 0.2 (ref 0.0–1.0)
pH: 6 (ref 5.0–8.0)

## 2022-10-18 LAB — COMPREHENSIVE METABOLIC PANEL
ALT: 12 U/L (ref 0–53)
AST: 14 U/L (ref 0–37)
Albumin: 4.4 g/dL (ref 3.5–5.2)
Alkaline Phosphatase: 44 U/L (ref 39–117)
BUN: 22 mg/dL (ref 6–23)
CO2: 25 mEq/L (ref 19–32)
Calcium: 9.2 mg/dL (ref 8.4–10.5)
Chloride: 103 mEq/L (ref 96–112)
Creatinine, Ser: 1.34 mg/dL (ref 0.40–1.50)
GFR: 54.22 mL/min — ABNORMAL LOW (ref >60.00)
Glucose, Bld: 104 mg/dL — ABNORMAL HIGH (ref 70–99)
Potassium: 4 mEq/L (ref 3.5–5.1)
Sodium: 136 mEq/L (ref 135–145)
Total Bilirubin: 0.8 mg/dL (ref 0.2–1.2)
Total Protein: 6.9 g/dL (ref 6.0–8.3)

## 2022-10-18 LAB — CBC
HCT: 45.9 % (ref 39.0–52.0)
Hemoglobin: 14.7 g/dL (ref 13.0–17.0)
MCHC: 32.1 g/dL (ref 30.0–36.0)
MCV: 91.6 fl (ref 78.0–100.0)
Platelets: 162 10*3/uL (ref 150.0–400.0)
RBC: 5.01 Mil/uL (ref 4.22–5.81)
RDW: 14.9 % (ref 11.5–15.5)
WBC: 4.8 10*3/uL (ref 4.0–10.5)

## 2022-10-18 LAB — LIPID PANEL
Cholesterol: 140 mg/dL (ref 0–200)
HDL: 52.4 mg/dL (ref >39.00)
LDL Cholesterol: 69 mg/dL (ref 0–99)
NonHDL: 87.33
Total CHOL/HDL Ratio: 3
Triglycerides: 91 mg/dL (ref 0.0–149.0)
VLDL: 18.2 mg/dL (ref 0.0–40.0)

## 2022-10-18 LAB — HEMOGLOBIN A1C: Hgb A1c MFr Bld: 6.2 % (ref 4.6–6.5)

## 2022-10-18 LAB — PSA: PSA: 3.27 ng/mL (ref 0.10–4.00)

## 2022-10-18 MED ORDER — LISINOPRIL 20 MG PO TABS
20.0000 mg | ORAL_TABLET | Freq: Every day | ORAL | 3 refills | Status: DC
Start: 2022-10-18 — End: 2023-10-31

## 2022-10-18 NOTE — Progress Notes (Signed)
Established Patient Office Visit   Subjective:  Patient ID: George Bishop, male    DOB: [birth year]  Age: 67 y.o. MRN: [id number]  Chief Complaint  Patient presents with   Medical Management of Chronic Issues    6 week follow up, no concerns.     HPI Encounter Diagnoses  Name Primary?   Essential hypertension Yes   Tobacco use    Elevated glucose    Healthcare maintenance    Presbycusis of both ears    Here today for follow-up hypertension treated with lisinopril 40 mg.  It turns out he is been taking half a pill.  His blood pressure has been controlled well on the 20 mg.  He does not have access to dental care.  He is exercising intermittently.  He continues to smoke 1 to 2 cigarettes daily.  He experiences nocturia x 1.  His urine flow is strong.  He stools daily.  He has seen no blood in his stool or urine.  Burmese Nurse, learning disability was used for the encounter.   Review of Systems  Constitutional: Negative.   HENT: Negative.    Eyes:  Negative for blurred vision, discharge and redness.  Respiratory: Negative.    Cardiovascular: Negative.   Gastrointestinal:  Negative for abdominal pain, blood in stool, constipation and melena.  Genitourinary: Negative.  Negative for dysuria, frequency, hematuria and urgency.  Musculoskeletal: Negative.  Negative for myalgias.  Skin:  Negative for rash.  Neurological:  Negative for tingling, loss of consciousness and weakness.  Endo/Heme/Allergies:  Negative for polydipsia.     Current Outpatient Medications:    acetaminophen (TYLENOL) 500 MG tablet, Take by mouth., Disp: , Rfl:    fluticasone (FLONASE) 50 MCG/ACT nasal spray, Place 1 spray into both nostrils daily., Disp: 15.8 mL, Rfl: 1   lisinopril (ZESTRIL) 20 MG tablet, Take 1 tablet (20 mg total) by mouth daily., Disp: 90 tablet, Rfl: 3   methocarbamol (ROBAXIN) 500 MG tablet, Take 1 tablet (500 mg total) by mouth every 8 (eight) hours as needed for muscle spasms. (Patient not taking:  Reported on 10/10/2022), Disp: 40 tablet, Rfl: 0   Objective:     BP 118/78 (BP Location: Left Arm, Patient Position: Sitting, Cuff Size: Normal)   Pulse 94   Temp 98.1 F (36.7 C) (Temporal)   Ht 5\' 6"  (1.676 m)   Wt 161 lb 9.6 oz (73.3 kg)   SpO2 97%   BMI 26.08 kg/m    Physical Exam Constitutional:      General: He is not in acute distress.    Appearance: Normal appearance. He is not ill-appearing, toxic-appearing or diaphoretic.  HENT:     Head: Normocephalic and atraumatic.     Right Ear: Tympanic membrane, ear canal and external ear normal.     Left Ear: Tympanic membrane, ear canal and external ear normal.     Mouth/Throat:     Mouth: Mucous membranes are moist.     Pharynx: Oropharynx is clear. No oropharyngeal exudate or posterior oropharyngeal erythema.  Eyes:     General: No scleral icterus.       Right eye: No discharge.        Left eye: No discharge.     Extraocular Movements: Extraocular movements intact.     Conjunctiva/sclera: Conjunctivae normal.     Pupils: Pupils are equal, round, and reactive to light.  Cardiovascular:     Rate and Rhythm: Normal rate and regular rhythm.  Pulmonary:  Effort: Pulmonary effort is normal. No respiratory distress.     Breath sounds: Normal breath sounds.  Abdominal:     General: Bowel sounds are normal.     Tenderness: There is no abdominal tenderness. There is no rebound.  Musculoskeletal:     Cervical back: No rigidity or tenderness.  Lymphadenopathy:     Cervical: No cervical adenopathy.  Skin:    General: Skin is warm and dry.  Neurological:     Mental Status: He is alert and oriented to person, place, and time.  Psychiatric:        Mood and Affect: Mood normal.        Behavior: Behavior normal.      No results found for any visits on 10/18/22.    The 10-year ASCVD risk score (Arnett DK, et al., 2019) is: 17.6%    Assessment & Plan:   Essential hypertension -     CBC -     Comprehensive  metabolic panel -     Urinalysis, Routine w reflex microscopic -     Lisinopril; Take 1 tablet (20 mg total) by mouth daily.  Dispense: 90 tablet; Refill: 3  Tobacco use  Elevated glucose -     Hemoglobin A1c  Healthcare maintenance -     Lipid panel -     PSA  Presbycusis of both ears    Return in about 3 months (around 01/18/2023).  Patient was advised to stop smoking entirely and he is just not interested in quitting.  He was encouraged to exercise for 30 minutes daily.  Walking would be sufficient.  Will continue lisinopril 20 mg daily.  Checking A1c and lipid profile.  He did have a cookie this morning for breakfast but is otherwise fasting.  Audiology consultation has been recommended for hearing amplification.  They have a great lunch  Mliss Sax, MD

## 2022-11-04 ENCOUNTER — Ambulatory Visit (INDEPENDENT_AMBULATORY_CARE_PROVIDER_SITE_OTHER): Payer: Medicare Other | Admitting: Family Medicine

## 2022-11-04 ENCOUNTER — Encounter: Payer: Self-pay | Admitting: Family Medicine

## 2022-11-04 VITALS — BP 132/72 | HR 86 | Temp 97.0°F | Ht 66.0 in | Wt 160.4 lb

## 2022-11-04 DIAGNOSIS — J301 Allergic rhinitis due to pollen: Secondary | ICD-10-CM

## 2022-11-04 DIAGNOSIS — Z603 Acculturation difficulty: Secondary | ICD-10-CM

## 2022-11-04 DIAGNOSIS — Z720 Tobacco use: Secondary | ICD-10-CM | POA: Diagnosis not present

## 2022-11-04 DIAGNOSIS — R7303 Prediabetes: Secondary | ICD-10-CM | POA: Diagnosis not present

## 2022-11-04 MED ORDER — PRAVASTATIN SODIUM 10 MG PO TABS
10.0000 mg | ORAL_TABLET | Freq: Every evening | ORAL | 2 refills | Status: DC
Start: 2022-11-04 — End: 2024-01-20

## 2022-11-04 MED ORDER — FLUTICASONE PROPIONATE 50 MCG/ACT NA SUSP
1.0000 | Freq: Every day | NASAL | 1 refills | Status: DC
Start: 2022-11-04 — End: 2024-01-20

## 2022-11-04 MED ORDER — METFORMIN HCL ER 500 MG PO TB24
500.0000 mg | ORAL_TABLET | Freq: Every evening | ORAL | 1 refills | Status: DC
Start: 2022-11-04 — End: 2024-01-20

## 2022-11-04 NOTE — Progress Notes (Signed)
Established Patient Office Visit   Subjective:  Patient ID: George Bishop, male    DOB: 1955-12-10  Age: 67 y.o. MRN: [id number]  Chief Complaint  Patient presents with   Follow-up    Follow up leb review. Pt has no questions or concerns.     HPI Encounter Diagnoses  Name Primary?   Prediabetes Yes   Seasonal allergic rhinitis due to pollen    Language barrier    Tobacco use    Here for follow-up of elevated A1c into the prediabetic/near diabetic range.  No family history of diabetes denies visual changes, urinary frequency or weight loss.  He exercises by walking on occasion.  He does have a favorable lipid profile but continues to smoke and refuses to quit.  Burmese interpreter was used   Review of Systems  Constitutional: Negative.   HENT: Negative.    Eyes:  Negative for blurred vision, discharge and redness.  Respiratory: Negative.    Cardiovascular: Negative.   Gastrointestinal:  Negative for abdominal pain.  Genitourinary: Negative.   Musculoskeletal: Negative.  Negative for myalgias.  Skin:  Negative for rash.  Neurological:  Negative for tingling, loss of consciousness and weakness.  Endo/Heme/Allergies:  Negative for polydipsia.     Current Outpatient Medications:    acetaminophen (TYLENOL) 500 MG tablet, Take by mouth., Disp: , Rfl:    lisinopril (ZESTRIL) 20 MG tablet, Take 1 tablet (20 mg total) by mouth daily., Disp: 90 tablet, Rfl: 3   metFORMIN (GLUCOPHAGE-XR) 500 MG 24 hr tablet, Take 1 tablet (500 mg total) by mouth at bedtime., Disp: 90 tablet, Rfl: 1   methocarbamol (ROBAXIN) 500 MG tablet, Take 1 tablet (500 mg total) by mouth every 8 (eight) hours as needed for muscle spasms., Disp: 40 tablet, Rfl: 0   pravastatin (PRAVACHOL) 10 MG tablet, Take 1 tablet (10 mg total) by mouth at bedtime., Disp: 90 tablet, Rfl: 2   fluticasone (FLONASE) 50 MCG/ACT nasal spray, Place 1 spray into both nostrils daily., Disp: 15.8 mL, Rfl: 1   Objective:     BP  132/72   Pulse 86   Temp (!) 97 F (36.1 C)   Ht 5\' 6"  (1.676 m)   Wt 160 lb 6.4 oz (72.8 kg)   SpO2 99%   BMI 25.89 kg/m    Physical Exam Constitutional:      General: He is not in acute distress.    Appearance: Normal appearance. He is not ill-appearing, toxic-appearing or diaphoretic.  HENT:     Head: Normocephalic and atraumatic.     Right Ear: External ear normal.     Left Ear: External ear normal.  Eyes:     General: No scleral icterus.       Right eye: No discharge.        Left eye: No discharge.     Extraocular Movements: Extraocular movements intact.     Conjunctiva/sclera: Conjunctivae normal.  Pulmonary:     Effort: Pulmonary effort is normal. No respiratory distress.  Skin:    General: Skin is warm and dry.  Neurological:     Mental Status: He is alert and oriented to person, place, and time.  Psychiatric:        Mood and Affect: Mood normal.        Behavior: Behavior normal.      No results found for any visits on 11/04/22.    The 10-year ASCVD risk score (Arnett DK, et al., 2019) is: 20.7%  Assessment & Plan:   Prediabetes -     metFORMIN HCl ER; Take 1 tablet (500 mg total) by mouth at bedtime.  Dispense: 90 tablet; Refill: 1 -     Pravastatin Sodium; Take 1 tablet (10 mg total) by mouth at bedtime.  Dispense: 90 tablet; Refill: 2  Seasonal allergic rhinitis due to pollen -     Fluticasone Propionate; Place 1 spray into both nostrils daily.  Dispense: 15.8 mL; Refill: 1  Language barrier  Tobacco use    Return in about 3 months (around 02/04/2023).  Discussed using metformin to prevent the progression of prediabetes to diabetes.  Advised that he may experience stomach cramping and loose stool for the first few weeks but for most passes.  Will start low-dose probably call for vascular disease prevention.  Explained that with diabetes he is at higher risk for vascular disease.  Have discussed his tobacco use on multiple occasions.  He is  smoking a few cigarettes a day and refuses to quit.  Mliss Sax, MD

## 2023-06-26 DIAGNOSIS — R051 Acute cough: Secondary | ICD-10-CM | POA: Diagnosis not present

## 2023-06-26 DIAGNOSIS — J40 Bronchitis, not specified as acute or chronic: Secondary | ICD-10-CM | POA: Diagnosis not present

## 2023-06-26 DIAGNOSIS — R5383 Other fatigue: Secondary | ICD-10-CM | POA: Diagnosis not present

## 2023-09-25 DIAGNOSIS — R051 Acute cough: Secondary | ICD-10-CM | POA: Diagnosis not present

## 2023-09-25 DIAGNOSIS — Z20822 Contact with and (suspected) exposure to covid-19: Secondary | ICD-10-CM | POA: Diagnosis not present

## 2023-09-25 DIAGNOSIS — R062 Wheezing: Secondary | ICD-10-CM | POA: Diagnosis not present

## 2023-09-25 DIAGNOSIS — J301 Allergic rhinitis due to pollen: Secondary | ICD-10-CM | POA: Diagnosis not present

## 2023-09-30 ENCOUNTER — Encounter: Payer: Self-pay | Admitting: Family Medicine

## 2023-09-30 ENCOUNTER — Ambulatory Visit: Payer: Self-pay | Admitting: Family Medicine

## 2023-09-30 ENCOUNTER — Ambulatory Visit (INDEPENDENT_AMBULATORY_CARE_PROVIDER_SITE_OTHER): Admitting: Family Medicine

## 2023-09-30 VITALS — BP 120/76 | HR 109 | Temp 97.1°F | Ht 66.0 in | Wt 156.2 lb

## 2023-09-30 DIAGNOSIS — R7303 Prediabetes: Secondary | ICD-10-CM

## 2023-09-30 DIAGNOSIS — Z13220 Encounter for screening for lipoid disorders: Secondary | ICD-10-CM | POA: Diagnosis not present

## 2023-09-30 DIAGNOSIS — J22 Unspecified acute lower respiratory infection: Secondary | ICD-10-CM

## 2023-09-30 DIAGNOSIS — Z720 Tobacco use: Secondary | ICD-10-CM

## 2023-09-30 DIAGNOSIS — Z1159 Encounter for screening for other viral diseases: Secondary | ICD-10-CM

## 2023-09-30 DIAGNOSIS — Z125 Encounter for screening for malignant neoplasm of prostate: Secondary | ICD-10-CM

## 2023-09-30 DIAGNOSIS — J449 Chronic obstructive pulmonary disease, unspecified: Secondary | ICD-10-CM | POA: Diagnosis not present

## 2023-09-30 DIAGNOSIS — I10 Essential (primary) hypertension: Secondary | ICD-10-CM

## 2023-09-30 DIAGNOSIS — Z0000 Encounter for general adult medical examination without abnormal findings: Secondary | ICD-10-CM | POA: Diagnosis not present

## 2023-09-30 LAB — URINALYSIS, ROUTINE W REFLEX MICROSCOPIC
Bilirubin Urine: NEGATIVE
Ketones, ur: NEGATIVE
Nitrite: NEGATIVE
Specific Gravity, Urine: 1.02 (ref 1.000–1.030)
Urine Glucose: NEGATIVE
Urobilinogen, UA: 0.2 (ref 0.0–1.0)
pH: 6 (ref 5.0–8.0)

## 2023-09-30 LAB — COMPREHENSIVE METABOLIC PANEL WITH GFR
ALT: 19 U/L (ref 0–53)
AST: 16 U/L (ref 0–37)
Albumin: 4.6 g/dL (ref 3.5–5.2)
Alkaline Phosphatase: 53 U/L (ref 39–117)
BUN: 21 mg/dL (ref 6–23)
CO2: 25 meq/L (ref 19–32)
Calcium: 9.6 mg/dL (ref 8.4–10.5)
Chloride: 103 meq/L (ref 96–112)
Creatinine, Ser: 1.09 mg/dL (ref 0.40–1.50)
GFR: 69 mL/min (ref >60.00)
Glucose, Bld: 135 mg/dL — ABNORMAL HIGH (ref 70–99)
Potassium: 3.7 meq/L (ref 3.5–5.1)
Sodium: 139 meq/L (ref 135–145)
Total Bilirubin: 0.8 mg/dL (ref 0.2–1.2)
Total Protein: 7.4 g/dL (ref 6.0–8.3)

## 2023-09-30 LAB — CBC WITH DIFFERENTIAL/PLATELET
Basophils Absolute: 0 10*3/uL (ref 0.0–0.1)
Basophils Relative: 0.8 % (ref 0.0–3.0)
Eosinophils Absolute: 0.1 10*3/uL (ref 0.0–0.7)
Eosinophils Relative: 3.4 % (ref 0.0–5.0)
HCT: 47.8 % (ref 39.0–52.0)
Hemoglobin: 15.9 g/dL (ref 13.0–17.0)
Lymphocytes Relative: 25.6 % (ref 12.0–46.0)
Lymphs Abs: 1.1 10*3/uL (ref 0.7–4.0)
MCHC: 33.3 g/dL (ref 30.0–36.0)
MCV: 88 fl (ref 78.0–100.0)
Monocytes Absolute: 0.3 10*3/uL (ref 0.1–1.0)
Monocytes Relative: 7.8 % (ref 3.0–12.0)
Neutro Abs: 2.7 10*3/uL (ref 1.4–7.7)
Neutrophils Relative %: 62.4 % (ref 43.0–77.0)
Platelets: 170 10*3/uL (ref 150.0–400.0)
RBC: 5.43 Mil/uL (ref 4.22–5.81)
RDW: 14.3 % (ref 11.5–15.5)
WBC: 4.3 10*3/uL (ref 4.0–10.5)

## 2023-09-30 LAB — LIPID PANEL
Cholesterol: 136 mg/dL (ref 0–200)
HDL: 36.5 mg/dL — ABNORMAL LOW (ref >39.00)
LDL Cholesterol: 80 mg/dL (ref 0–99)
NonHDL: 99.32
Total CHOL/HDL Ratio: 4
Triglycerides: 95 mg/dL (ref 0.0–149.0)
VLDL: 19 mg/dL (ref 0.0–40.0)

## 2023-09-30 LAB — PSA: PSA: 4.71 ng/mL — ABNORMAL HIGH (ref 0.10–4.00)

## 2023-09-30 LAB — HEMOGLOBIN A1C: Hgb A1c MFr Bld: 6.2 % (ref 4.6–6.5)

## 2023-09-30 MED ORDER — DOXYCYCLINE HYCLATE 100 MG PO TABS: 100.0000 mg | ORAL_TABLET | Freq: Two times a day (BID) | ORAL | 0 refills | Status: AC

## 2023-09-30 MED ORDER — PREDNISONE 20 MG PO TABS: 20.0000 mg | ORAL_TABLET | Freq: Two times a day (BID) | ORAL | 0 refills | Status: AC

## 2023-09-30 NOTE — Progress Notes (Signed)
 Established Patient Office Visit   Subjective:  Patient ID: George Bishop, male    DOB: October 26, 1955  Age: 68 y.o. MRN: [id number]  Chief Complaint  Patient presents with   Annual Exam    CPE. Pt has decrease in hearing, foaming urine, cough x 1 week.     HPI Encounter Diagnoses  Name Primary?   Healthcare maintenance Yes   Prediabetes    Essential hypertension    Tobacco use    Screening for cholesterol level    Screening for prostate cancer    Encounter for hepatitis C screening test for low risk patient    Chronic obstructive pulmonary disease, unspecified COPD type (HCC)    Lower resp. tract infection    For follow-up of above.  [place]ese interpretation used.  Continues with lisinopril .  He quit smoking 3 weeks ago.  He has continued with lisinopril .  He has not been taking the metformin  or pravastatin .  4-day history of a nonproductive cough.  There has been some wheezing.  No fevers or chills.  He was seen at urgent care and given prescriptions for an albuterol inhaler Tessalon .  He does not feel as though they have helped much.  List   Review of Systems  Constitutional: Negative.   HENT: Negative.    Eyes:  Negative for blurred vision, discharge and redness.  Respiratory:  Positive for cough.   Cardiovascular: Negative.   Gastrointestinal:  Negative for abdominal pain.  Genitourinary: Negative.   Musculoskeletal: Negative.  Negative for myalgias.  Skin:  Negative for rash.  Neurological:  Negative for tingling, loss of consciousness and weakness.  Endo/Heme/Allergies:  Negative for polydipsia.      09/30/2023   10:37 AM 10/18/2022   10:10 AM 09/05/2022    1:59 PM  Depression screen PHQ 2/9  Decreased Interest 0 0 0  Down, Depressed, Hopeless 0 0 0  PHQ - 2 Score 0 0 0      Current Outpatient Medications:    acetaminophen  (TYLENOL ) 500 MG tablet, Take by mouth., Disp: , Rfl:    doxycycline (VIBRA-TABS) 100 MG tablet, Take 1 tablet (100 mg total) by mouth 2  (two) times daily for 10 days., Disp: 20 tablet, Rfl: 0   fluticasone  (FLONASE ) 50 MCG/ACT nasal spray, Place 1 spray into both nostrils daily., Disp: 15.8 mL, Rfl: 1   lisinopril  (ZESTRIL ) 20 MG tablet, Take 1 tablet (20 mg total) by mouth daily., Disp: 90 tablet, Rfl: 3   predniSONE  (DELTASONE ) 20 MG tablet, Take 1 tablet (20 mg total) by mouth 2 (two) times daily with a meal for 7 days., Disp: 14 tablet, Rfl: 0   metFORMIN  (GLUCOPHAGE -XR) 500 MG 24 hr tablet, Take 1 tablet (500 mg total) by mouth at bedtime. (Patient not taking: Reported on 09/30/2023), Disp: 90 tablet, Rfl: 1   methocarbamol  (ROBAXIN ) 500 MG tablet, Take 1 tablet (500 mg total) by mouth every 8 (eight) hours as needed for muscle spasms. (Patient not taking: Reported on 09/30/2023), Disp: 40 tablet, Rfl: 0   pravastatin  (PRAVACHOL ) 10 MG tablet, Take 1 tablet (10 mg total) by mouth at bedtime. (Patient not taking: Reported on 09/30/2023), Disp: 90 tablet, Rfl: 2   Objective:     BP 120/76 (Cuff Size: Normal)   Pulse (!) 109   Temp (!) 97.1 F (36.2 C) (Temporal)   Ht 5\' 6"  (1.676 m)   Wt 156 lb 3.2 oz (70.9 kg)   SpO2 96%   BMI 25.21 kg/m  BP  Readings from Last 3 Encounters:  09/30/23 120/76  11/04/22 132/72  10/18/22 118/78   Wt Readings from Last 3 Encounters:  09/30/23 156 lb 3.2 oz (70.9 kg)  11/04/22 160 lb 6.4 oz (72.8 kg)  10/18/22 161 lb 9.6 oz (73.3 kg)      Physical Exam Constitutional:      General: He is not in acute distress.    Appearance: Normal appearance. He is not ill-appearing, toxic-appearing or diaphoretic.  HENT:     Head: Normocephalic and atraumatic.     Right Ear: External ear normal.     Left Ear: External ear normal.     Mouth/Throat:     Mouth: Mucous membranes are moist.     Pharynx: Oropharynx is clear. No oropharyngeal exudate or posterior oropharyngeal erythema.  Eyes:     General: No scleral icterus.       Right eye: No discharge.        Left eye: No discharge.      Extraocular Movements: Extraocular movements intact.     Conjunctiva/sclera: Conjunctivae normal.     Pupils: Pupils are equal, round, and reactive to light.  Cardiovascular:     Rate and Rhythm: Normal rate and regular rhythm.  Pulmonary:     Effort: Pulmonary effort is normal. No respiratory distress.     Breath sounds: Decreased air movement present. Examination of the right-lower field reveals rhonchi. Examination of the left-lower field reveals rhonchi. Wheezing and rhonchi present. No rales.  Abdominal:     General: Bowel sounds are normal.     Tenderness: There is no abdominal tenderness. There is no right CVA tenderness, left CVA tenderness or guarding.  Musculoskeletal:     Cervical back: No rigidity or tenderness.  Lymphadenopathy:     Cervical: No cervical adenopathy.  Skin:    General: Skin is warm and dry.  Neurological:     Mental Status: He is alert and oriented to person, place, and time.  Psychiatric:        Mood and Affect: Mood normal.        Behavior: Behavior normal.      No results found for any visits on 09/30/23.    The 10-year ASCVD risk score (Arnett DK, et al., 2019) is: 15.5%    Assessment & Plan:   Healthcare maintenance  Prediabetes -     Comprehensive metabolic panel with GFR -     Hemoglobin A1c  Essential hypertension -     CBC with Differential/Platelet -     Comprehensive metabolic panel with GFR -     Urinalysis, Routine w reflex microscopic  Tobacco use  Screening for cholesterol level -     Comprehensive metabolic panel with GFR -     Lipid panel  Screening for prostate cancer -     PSA  Encounter for hepatitis C screening test for low risk patient -     Hepatitis C antibody  Chronic obstructive pulmonary disease, unspecified COPD type (HCC) -     Pulmonary Visit -     predniSONE ; Take 1 tablet (20 mg total) by mouth 2 (two) times daily with a meal for 7 days.  Dispense: 14 tablet; Refill: 0  Lower resp. tract  infection -     Doxycycline Hyclate; Take 1 tablet (100 mg total) by mouth 2 (two) times daily for 10 days.  Dispense: 20 tablet; Refill: 0    Return in about 6 months (around 04/01/2024).  Rechecking routine labs.  He understands that I may have represcribed the pravastatin  and metformin  pending results of labs.  He would rather not take these medications if he absolutely does them.  Congratulated him on quitting smoking.  Tonna Frederic, MD

## 2023-10-01 LAB — HEPATITIS C ANTIBODY: Hepatitis C Ab: NONREACTIVE

## 2023-10-31 ENCOUNTER — Other Ambulatory Visit: Payer: Self-pay | Admitting: Family Medicine

## 2023-10-31 DIAGNOSIS — I10 Essential (primary) hypertension: Secondary | ICD-10-CM

## 2023-12-11 ENCOUNTER — Ambulatory Visit (INDEPENDENT_AMBULATORY_CARE_PROVIDER_SITE_OTHER): Admitting: Family Medicine

## 2023-12-11 ENCOUNTER — Encounter: Payer: Self-pay | Admitting: Family Medicine

## 2023-12-11 VITALS — BP 124/78 | HR 75 | Temp 98.6°F | Ht 66.0 in | Wt 158.0 lb

## 2023-12-11 DIAGNOSIS — R3121 Asymptomatic microscopic hematuria: Secondary | ICD-10-CM

## 2023-12-11 DIAGNOSIS — R21 Rash and other nonspecific skin eruption: Secondary | ICD-10-CM

## 2023-12-11 DIAGNOSIS — R972 Elevated prostate specific antigen [PSA]: Secondary | ICD-10-CM

## 2023-12-11 DIAGNOSIS — R8290 Unspecified abnormal findings in urine: Secondary | ICD-10-CM

## 2023-12-11 DIAGNOSIS — R351 Nocturia: Secondary | ICD-10-CM

## 2023-12-11 DIAGNOSIS — N401 Enlarged prostate with lower urinary tract symptoms: Secondary | ICD-10-CM

## 2023-12-11 DIAGNOSIS — R319 Hematuria, unspecified: Secondary | ICD-10-CM

## 2023-12-11 DIAGNOSIS — Z87891 Personal history of nicotine dependence: Secondary | ICD-10-CM

## 2023-12-11 LAB — URINALYSIS, ROUTINE W REFLEX MICROSCOPIC
Bilirubin Urine: NEGATIVE
Ketones, ur: NEGATIVE
Nitrite: NEGATIVE
Specific Gravity, Urine: 1.02 (ref 1.000–1.030)
Total Protein, Urine: NEGATIVE
Urine Glucose: NEGATIVE
Urobilinogen, UA: 0.2 (ref 0.0–1.0)
pH: 7 (ref 5.0–8.0)

## 2023-12-11 MED ORDER — NYSTATIN-TRIAMCINOLONE 100000-0.1 UNIT/GM-% EX OINT: 1.0000 | TOPICAL_OINTMENT | Freq: Two times a day (BID) | CUTANEOUS | 0 refills | Status: DC

## 2023-12-11 NOTE — Progress Notes (Addendum)
 Established Patient Office Visit   Subjective:  Patient ID: George Bishop, male    DOB: Mar 17, 1956  Age: 68 y.o. MRN: [id number]  Chief Complaint  Patient presents with   Rash    Rash on top of both feet x 2 months. Pt complains of itching. Redness. Pt has been treating with hydrocortisone ointment.     Rash   Encounter Diagnoses  Name Primary?   Abnormal urinalysis Yes   Rash    Elevated PSA    Benign prostatic hyperplasia with nocturia    Asymptomatic microscopic hematuria    Hematuria, unspecified type    History of tobacco use    For follow-up of the above.  Denies difficulty with urination.  He does experience nocturia x 1 nightly.  Denies a decrease in the force of his stream or difficulty generating a stream.  There is no dysuria or frequency.  He has a rash that is pruritic on the tops of his feet.  It is located nowhere else in the body.  Saw audiologist who recommended ear irrigation.   Review of Systems  Constitutional: Negative.   HENT:  Positive for hearing loss. Negative for ear discharge and ear pain.   Eyes:  Negative for blurred vision, discharge and redness.  Respiratory: Negative.    Cardiovascular: Negative.   Gastrointestinal:  Negative for abdominal pain.  Genitourinary: Negative.   Musculoskeletal: Negative.  Negative for myalgias.  Skin:  Positive for itching and rash.  Neurological:  Negative for tingling, loss of consciousness and weakness.  Endo/Heme/Allergies:  Negative for polydipsia.     Current Outpatient Medications:    acetaminophen  (TYLENOL ) 500 MG tablet, Take by mouth., Disp: , Rfl:    fluticasone  (FLONASE ) 50 MCG/ACT nasal spray, Place 1 spray into both nostrils daily., Disp: 15.8 mL, Rfl: 1   lisinopril  (ZESTRIL ) 20 MG tablet, TAKE 1 TABLET(20 MG) BY MOUTH DAILY, Disp: 90 tablet, Rfl: 3   nystatin -triamcinolone  ointment (MYCOLOG), Apply 1 Application topically 2 (two) times daily., Disp: 60 g, Rfl: 0   metFORMIN  (GLUCOPHAGE -XR)  500 MG 24 hr tablet, Take 1 tablet (500 mg total) by mouth at bedtime. (Patient not taking: Reported on 12/11/2023), Disp: 90 tablet, Rfl: 1   methocarbamol  (ROBAXIN ) 500 MG tablet, Take 1 tablet (500 mg total) by mouth every 8 (eight) hours as needed for muscle spasms. (Patient not taking: Reported on 12/11/2023), Disp: 40 tablet, Rfl: 0   pravastatin  (PRAVACHOL ) 10 MG tablet, Take 1 tablet (10 mg total) by mouth at bedtime. (Patient not taking: Reported on 12/11/2023), Disp: 90 tablet, Rfl: 2   Objective:     BP 124/78 (BP Location: Left Arm, Patient Position: Sitting, Cuff Size: Normal)   Pulse 75   Temp 98.6 F (37 C) (Temporal)   Ht 5' 6 (1.676 m)   Wt 158 lb (71.7 kg)   SpO2 96%   BMI 25.50 kg/m    Physical Exam Constitutional:      General: He is not in acute distress.    Appearance: Normal appearance. He is not ill-appearing, toxic-appearing or diaphoretic.  HENT:     Head: Normocephalic and atraumatic.     Right Ear: External ear normal.     Left Ear: External ear normal.     Ears:   Eyes:     General: No scleral icterus.       Right eye: No discharge.        Left eye: No discharge.     Extraocular Movements:  Extraocular movements intact.     Conjunctiva/sclera: Conjunctivae normal.  Pulmonary:     Effort: Pulmonary effort is normal. No respiratory distress.  Skin:    General: Skin is warm and dry.      Neurological:     Mental Status: He is alert and oriented to person, place, and time.  Psychiatric:        Mood and Affect: Mood normal.        Behavior: Behavior normal.      Results for orders placed or performed in visit on 12/11/23  Urine Culture   Specimen: Urine  Result Value Ref Range   MICRO NUMBER: [id number]    SPECIMEN QUALITY: Adequate    Sample Source URINE    STATUS: FINAL    Result: No Growth   Urinalysis, Routine w reflex microscopic  Result Value Ref Range   Color, Urine YELLOW Yellow;Lt. Yellow;Straw;Dark Yellow;Amber;Green;Red;Brown    APPearance CLEAR Clear;Turbid;Slightly Cloudy;Cloudy   Specific Gravity, Urine 1.020 1.000 - 1.030   pH 7.0 5.0 - 8.0   Total Protein, Urine NEGATIVE Negative   Urine Glucose NEGATIVE Negative   Ketones, ur NEGATIVE Negative   Bilirubin Urine NEGATIVE Negative   Hgb urine dipstick TRACE-INTACT (A) Negative   Urobilinogen, UA 0.2 0.0 - 1.0   Leukocytes,Ua TRACE (A) Negative   Nitrite NEGATIVE Negative   WBC, UA 7-10/hpf (A) 0-2/hpf   RBC / HPF 3-6/hpf (A) 0-2/hpf   Squamous Epithelial / HPF Rare(0-4/hpf) Rare(0-4/hpf)  PSA  Result Value Ref Range   PSA 3.23 0.10 - 4.00 ng/mL      The 10-year ASCVD risk score (Arnett DK, et al., 2019) is: 18.8%    Assessment & Plan:   Abnormal urinalysis -     Urinalysis, Routine w reflex microscopic -     Urine Culture  Rash -     Nystatin -Triamcinolone ; Apply 1 Application topically 2 (two) times daily.  Dispense: 60 g; Refill: 0  Elevated PSA -     PSA  Benign prostatic hyperplasia with nocturia -     PSA  Asymptomatic microscopic hematuria  Hematuria, unspecified type -     Ambulatory referral to Urology  History of tobacco use -     Ambulatory referral to Urology    Return in about 3 months (around 03/12/2024), or if symptoms worsen or fail to improve.    Elsie Sim Lent, MD

## 2023-12-12 LAB — URINE CULTURE
MICRO NUMBER:: 16800878
Result:: NO GROWTH
SPECIMEN QUALITY:: ADEQUATE

## 2023-12-12 LAB — PSA: PSA: 3.23 ng/mL (ref 0.10–4.00)

## 2023-12-15 ENCOUNTER — Ambulatory Visit: Payer: Self-pay | Admitting: Family Medicine

## 2023-12-15 DIAGNOSIS — R319 Hematuria, unspecified: Secondary | ICD-10-CM | POA: Insufficient documentation

## 2023-12-15 NOTE — Addendum Note (Signed)
 Addended by: BERNETA ELSIE LABOR on: 12/15/2023 12:44 PM   Modules accepted: Orders

## 2023-12-27 ENCOUNTER — Emergency Department (HOSPITAL_COMMUNITY)
Admission: EM | Admit: 2023-12-27 | Discharge: 2023-12-27 | Disposition: A | Discharge status: Home / Self Care | Attending: Emergency Medicine | Admitting: Emergency Medicine

## 2023-12-27 ENCOUNTER — Emergency Department (HOSPITAL_COMMUNITY)

## 2023-12-27 DIAGNOSIS — M545 Low back pain, unspecified: Secondary | ICD-10-CM | POA: Diagnosis not present

## 2023-12-27 DIAGNOSIS — N132 Hydronephrosis with renal and ureteral calculous obstruction: Secondary | ICD-10-CM | POA: Diagnosis not present

## 2023-12-27 DIAGNOSIS — M5459 Other low back pain: Secondary | ICD-10-CM | POA: Diagnosis not present

## 2023-12-27 DIAGNOSIS — K5730 Diverticulosis of large intestine without perforation or abscess without bleeding: Secondary | ICD-10-CM | POA: Diagnosis not present

## 2023-12-27 DIAGNOSIS — N400 Enlarged prostate without lower urinary tract symptoms: Secondary | ICD-10-CM | POA: Diagnosis not present

## 2023-12-27 DIAGNOSIS — R932 Abnormal findings on diagnostic imaging of liver and biliary tract: Secondary | ICD-10-CM | POA: Diagnosis not present

## 2023-12-27 DIAGNOSIS — N201 Calculus of ureter: Secondary | ICD-10-CM

## 2023-12-27 LAB — URINALYSIS, ROUTINE W REFLEX MICROSCOPIC
Bilirubin Urine: NEGATIVE
Glucose, UA: NEGATIVE mg/dL
Hgb urine dipstick: NEGATIVE
Ketones, ur: NEGATIVE mg/dL
Nitrite: NEGATIVE
Protein, ur: NEGATIVE mg/dL
Specific Gravity, Urine: 1.008 (ref 1.005–1.030)
pH: 7 (ref 5.0–8.0)

## 2023-12-27 LAB — CBC WITH DIFFERENTIAL/PLATELET
Abs Immature Granulocytes: 0.01 K/uL (ref 0.00–0.07)
Basophils Absolute: 0 K/uL (ref 0.0–0.1)
Basophils Relative: 1 %
Eosinophils Absolute: 0.4 K/uL (ref 0.0–0.5)
Eosinophils Relative: 11 %
HCT: 45.1 % (ref 39.0–52.0)
Hemoglobin: 14.5 g/dL (ref 13.0–17.0)
Immature Granulocytes: 0 %
Lymphocytes Relative: 29 %
Lymphs Abs: 1.1 K/uL (ref 0.7–4.0)
MCH: 29.2 pg (ref 26.0–34.0)
MCHC: 32.2 g/dL (ref 30.0–36.0)
MCV: 90.9 fL (ref 80.0–100.0)
Monocytes Absolute: 0.3 K/uL (ref 0.1–1.0)
Monocytes Relative: 8 %
Neutro Abs: 2 K/uL (ref 1.7–7.7)
Neutrophils Relative %: 51 %
Platelets: 141 K/uL — ABNORMAL LOW (ref 150–400)
RBC: 4.96 MIL/uL (ref 4.22–5.81)
RDW: 13.9 % (ref 11.5–15.5)
WBC: 3.9 K/uL — ABNORMAL LOW (ref 4.0–10.5)
nRBC: 0 % (ref 0.0–0.2)

## 2023-12-27 LAB — COMPREHENSIVE METABOLIC PANEL WITH GFR
ALT: 10 U/L (ref 0–44)
AST: 15 U/L (ref 15–41)
Albumin: 3.8 g/dL (ref 3.5–5.0)
Alkaline Phosphatase: 30 U/L — ABNORMAL LOW (ref 38–126)
Anion gap: 8 (ref 5–15)
BUN: 18 mg/dL (ref 8–23)
CO2: 23 mmol/L (ref 22–32)
Calcium: 8.7 mg/dL — ABNORMAL LOW (ref 8.9–10.3)
Chloride: 106 mmol/L (ref 98–111)
Creatinine, Ser: 0.94 mg/dL (ref 0.61–1.24)
GFR, Estimated: >60 mL/min (ref >60)
Glucose, Bld: 92 mg/dL (ref 70–99)
Potassium: 3.9 mmol/L (ref 3.5–5.1)
Sodium: 137 mmol/L (ref 135–145)
Total Bilirubin: 1.1 mg/dL (ref 0.0–1.2)
Total Protein: 6.7 g/dL (ref 6.5–8.1)

## 2023-12-27 MED ORDER — KETOROLAC TROMETHAMINE 15 MG/ML IJ SOLN
15.0000 mg | Freq: Once | INTRAMUSCULAR | Status: AC
  Administered 2023-12-27: 15 mg via INTRAVENOUS
  Filled 2023-12-27: qty 1

## 2023-12-27 MED ORDER — TAMSULOSIN HCL 0.4 MG PO CAPS: 0.4000 mg | ORAL_CAPSULE | Freq: Every day | ORAL | 0 refills | Status: DC

## 2023-12-27 MED ORDER — SODIUM CHLORIDE 0.9 % IV BOLUS
1000.0000 mL | Freq: Once | INTRAVENOUS | Status: AC
  Administered 2023-12-27: 1000 mL via INTRAVENOUS

## 2023-12-27 MED ORDER — MORPHINE SULFATE (PF) 4 MG/ML IV SOLN
4.0000 mg | Freq: Once | INTRAVENOUS | Status: AC
  Administered 2023-12-27: 4 mg via INTRAVENOUS
  Filled 2023-12-27: qty 1

## 2023-12-27 MED ORDER — MORPHINE SULFATE 15 MG PO TABS: 7.5000 mg | ORAL_TABLET | ORAL | 0 refills | Status: DC | PRN

## 2023-12-27 MED ORDER — ONDANSETRON HCL 4 MG/2ML IJ SOLN
4.0000 mg | Freq: Once | INTRAMUSCULAR | Status: AC
  Administered 2023-12-27: 4 mg via INTRAVENOUS
  Filled 2023-12-27: qty 2

## 2023-12-27 NOTE — ED Provider Notes (Signed)
 [facility] Provider Note   CSN: [id number] Arrival date & time: 12/27/23  9065     Patient presents with: No chief complaint on file.   George Bishop is a 68 y.o. male.   68 yo M with a chief complaint of left-sided low back pain.  Been going on for about 48 hours.  Worse with twisting turning and attempting to stand.  Feels like it is worse right after he wakes up from sleep and tries to get up to move around.  He denies radiation of the pain.  Denies trauma.  Denies loss of bowel or bladder denies loss of rectal sensation denies numbness or weakness to his legs.  He did have a kidney stone that required emergent removal some years ago.  He is worried that maybe this has reoccurred.  Denies urinary symptoms.  Denies abdominal pain.  A language interpreter was used.       Prior to Admission medications   Medication Sig Start Date End Date Taking? Authorizing Provider  morphine  (MSIR) 15 MG tablet Take 0.5 tablets (7.5 mg total) by mouth every 4 (four) hours as needed for severe pain (pain score 7-10). 12/27/23  Yes Emil Share, DO  tamsulosin  (FLOMAX ) 0.4 MG CAPS capsule Take 1 capsule (0.4 mg total) by mouth daily after supper. 12/27/23  Yes Emil Share, DO  acetaminophen  (TYLENOL ) 500 MG tablet Take by mouth. 12/22/18   [provider]  fluticasone  (FLONASE ) 50 MCG/ACT nasal spray Place 1 spray into both nostrils daily. 11/04/22   Berneta Elsie Sayre, MD  lisinopril  (ZESTRIL ) 20 MG tablet TAKE 1 TABLET(20 MG) BY MOUTH DAILY 10/31/23   Berneta Elsie Sayre, MD  metFORMIN  (GLUCOPHAGE -XR) 500 MG 24 hr tablet Take 1 tablet (500 mg total) by mouth at bedtime. Patient not taking: Reported on 12/11/2023 11/04/22   Berneta Elsie Sayre, MD  methocarbamol  (ROBAXIN ) 500 MG tablet Take 1 tablet (500 mg total) by mouth every 8 (eight) hours as needed for muscle spasms. Patient not taking: Reported on 12/11/2023 09/05/22   Berneta Elsie Sayre,  MD  nystatin -triamcinolone  ointment [facility]) Apply 1 Application topically 2 (two) times daily. 12/11/23   Berneta Elsie Sayre, MD  pravastatin  (PRAVACHOL ) 10 MG tablet Take 1 tablet (10 mg total) by mouth at bedtime. Patient not taking: Reported on 12/11/2023 11/04/22   Berneta Elsie Sayre, MD    Allergies: Patient has no known allergies.    Review of Systems  Updated Vital Signs BP (!) 146/93 (BP Location: Left Arm)   Pulse 70   Temp 97.6 F (36.4 C) (Oral)   Resp 19   Ht 5' 6 (1.676 m)   Wt 71.7 kg   SpO2 98%   BMI 25.50 kg/m   Physical Exam Vitals and nursing note reviewed.  Constitutional:      Appearance: He is well-developed.  HENT:     Head: Normocephalic and atraumatic.  Eyes:     Pupils: Pupils are equal, round, and reactive to light.  Neck:     Vascular: No JVD.  Cardiovascular:     Rate and Rhythm: Normal rate and regular rhythm.     Heart sounds: No murmur heard.    No friction rub. No gallop.  Pulmonary:     Effort: No respiratory distress.     Breath sounds: No wheezing.  Abdominal:     General: There is no distension.     Tenderness: There is no abdominal tenderness. There is no  guarding or rebound.  Musculoskeletal:        General: Normal range of motion.     Cervical back: Normal range of motion and neck supple.     Comments: No obvious midline spinal tenderness step-offs or deformities.  Pulse motor and sensation intact to the left lower extremity.  Reflexes are 2+ and equal.  Patient has some left lumbar paraspinal musculature tenderness.  Skin:    Coloration: Skin is not pale.     Findings: No rash.  Neurological:     Mental Status: He is alert and oriented to person, place, and time.  Psychiatric:        Behavior: Behavior normal.     (all labs ordered are listed, but only abnormal results are displayed) Labs Reviewed  CBC WITH DIFFERENTIAL/PLATELET - Abnormal; Notable for the following components:      Result Value   WBC 3.9 (*)     Platelets 141 (*)    All other components within normal limits  COMPREHENSIVE METABOLIC PANEL WITH GFR - Abnormal; Notable for the following components:   Calcium 8.7 (*)    Alkaline Phosphatase 30 (*)    All other components within normal limits  URINALYSIS, ROUTINE W REFLEX MICROSCOPIC - Abnormal; Notable for the following components:   Color, Urine STRAW (*)    Leukocytes,Ua SMALL (*)    Bacteria, UA RARE (*)    All other components within normal limits    EKG: None  Radiology: CT Renal Stone Study Result Date: 12/27/2023 CLINICAL DATA:  Abdominal/flank pain, stone suspected EXAM: CT ABDOMEN AND PELVIS WITHOUT CONTRAST; CT LUMBAR SPINE WITHOUT CONTRAST TECHNIQUE: Multidetector CT imaging of the abdomen and pelvis was performed following the standard protocol without IV contrast. RADIATION DOSE REDUCTION: This exam was performed according to the departmental dose-optimization program which includes automated exposure control, adjustment of the mA and/or kV according to patient size and/or use of iterative reconstruction technique. COMPARISON:  CT abdomen/pelvis dated 01/01/2018. FINDINGS: Lower chest: Mild dependent changes at the posterior lung bases. Hepatobiliary: 1.5 cm hypoattenuating focus in the right hepatic lobe likely represents a cyst or hemangioma. Gallbladder is contracted and otherwise grossly unremarkable. No biliary dilatation. Pancreas: Unremarkable. No pancreatic ductal dilatation or surrounding inflammatory changes. Spleen: Normal in size without focal abnormality. Adrenals/Urinary Tract: 9 x 7 mm calculus in the proximal right ureter with severe right hydronephrosis. The ureter distal to this level is normal in caliber. Bilateral nonobstructive renal calculi measuring up to 6 mm at the right lower pole and 3 mm of the left lower pole. No left-sided ureteral calculi or left-sided hydronephrosis. Bladder is unremarkable. Stomach/Bowel: Stomach and small bowel are grossly  unremarkable. Appendix appears normal. No obstruction or inflammatory changes. Scattered colonic diverticula. Vascular/Lymphatic: Abdominal aorta is normal in caliber with atherosclerotic calcification. No enlarged abdominal or pelvic lymph nodes. Reproductive: Prostate is mildly enlarged, similar to the prior exam. Other: No abdominopelvic ascites.  No intraperitoneal free air. Musculoskeletal: No acute osseous abnormality. No suspicious osseous lesion. Lumbar spine: Segmentation: 5 lumbar type vertebrae. Alignment: Similar straightening of the normal cervical lordosis. No static listhesis. Vertebrae: Vertebral body heights are maintained. No acute fracture or focal pathologic process. Paraspinal and other soft tissues: Negative. Disc levels: Mild multilevel degenerative disc height loss and predominantly anterior endplate osteophytosis of the lumbar spine. Disc height loss is most pronounced at L5-S1, similar to slightly progressed since the prior exam. Left greater than right facet arthropathy at L5-S1 contributes to moderate left foraminal narrowing. IMPRESSION:  1. 9 x 7 mm calculus in the proximal right ureter with severe right hydronephrosis. 2. Bilateral nonobstructive renal calculi measuring up to 6 mm at the right lower pole and 3 mm of the left lower pole. 3. Mild prostatomegaly. 4. Mild multilevel degenerative disc changes of the lumbar spine, most pronounced at L5-S1 with left greater than right facet arthropathy contributing to moderate left foraminal narrowing. 5.  Aortic Atherosclerosis (ICD10-I70.0). Electronically Signed   By: Harrietta Sherry M.D.   On: 12/27/2023 11:49   CT L-SPINE NO CHARGE Result Date: 12/27/2023 CLINICAL DATA:  Abdominal/flank pain, stone suspected EXAM: CT ABDOMEN AND PELVIS WITHOUT CONTRAST; CT LUMBAR SPINE WITHOUT CONTRAST TECHNIQUE: Multidetector CT imaging of the abdomen and pelvis was performed following the standard protocol without IV contrast. RADIATION DOSE  REDUCTION: This exam was performed according to the departmental dose-optimization program which includes automated exposure control, adjustment of the mA and/or kV according to patient size and/or use of iterative reconstruction technique. COMPARISON:  CT abdomen/pelvis dated 01/01/2018. FINDINGS: Lower chest: Mild dependent changes at the posterior lung bases. Hepatobiliary: 1.5 cm hypoattenuating focus in the right hepatic lobe likely represents a cyst or hemangioma. Gallbladder is contracted and otherwise grossly unremarkable. No biliary dilatation. Pancreas: Unremarkable. No pancreatic ductal dilatation or surrounding inflammatory changes. Spleen: Normal in size without focal abnormality. Adrenals/Urinary Tract: 9 x 7 mm calculus in the proximal right ureter with severe right hydronephrosis. The ureter distal to this level is normal in caliber. Bilateral nonobstructive renal calculi measuring up to 6 mm at the right lower pole and 3 mm of the left lower pole. No left-sided ureteral calculi or left-sided hydronephrosis. Bladder is unremarkable. Stomach/Bowel: Stomach and small bowel are grossly unremarkable. Appendix appears normal. No obstruction or inflammatory changes. Scattered colonic diverticula. Vascular/Lymphatic: Abdominal aorta is normal in caliber with atherosclerotic calcification. No enlarged abdominal or pelvic lymph nodes. Reproductive: Prostate is mildly enlarged, similar to the prior exam. Other: No abdominopelvic ascites.  No intraperitoneal free air. Musculoskeletal: No acute osseous abnormality. No suspicious osseous lesion. Lumbar spine: Segmentation: 5 lumbar type vertebrae. Alignment: Similar straightening of the normal cervical lordosis. No static listhesis. Vertebrae: Vertebral body heights are maintained. No acute fracture or focal pathologic process. Paraspinal and other soft tissues: Negative. Disc levels: Mild multilevel degenerative disc height loss and predominantly anterior  endplate osteophytosis of the lumbar spine. Disc height loss is most pronounced at L5-S1, similar to slightly progressed since the prior exam. Left greater than right facet arthropathy at L5-S1 contributes to moderate left foraminal narrowing. IMPRESSION: 1. 9 x 7 mm calculus in the proximal right ureter with severe right hydronephrosis. 2. Bilateral nonobstructive renal calculi measuring up to 6 mm at the right lower pole and 3 mm of the left lower pole. 3. Mild prostatomegaly. 4. Mild multilevel degenerative disc changes of the lumbar spine, most pronounced at L5-S1 with left greater than right facet arthropathy contributing to moderate left foraminal narrowing. 5.  Aortic Atherosclerosis (ICD10-I70.0). Electronically Signed   By: Harrietta Sherry M.D.   On: 12/27/2023 11:49     Procedures   Medications Ordered in the ED  ketorolac  (TORADOL ) 15 MG/ML injection 15 mg (has no administration in time range)  sodium chloride  0.9 % bolus 1,000 mL (1,000 mLs Intravenous New Bag/Given 12/27/23 1052)  morphine  (PF) 4 MG/ML injection 4 mg (4 mg Intravenous Given 12/27/23 1050)  ondansetron  (ZOFRAN ) injection 4 mg (4 mg Intravenous Given 12/27/23 1050)  Medical Decision Making Amount and/or Complexity of Data Reviewed Labs: ordered. Radiology: ordered.  Risk Prescription drug management.   68 yo M with a chief complaints of left-sided low back pain.  By history and physical it sounds musculoskeletal.  He does have a history of kidney stone that required intervention.  Will obtain CT imaging blood work treat pain and reassess.  UA without infection, renal function appears to be at baseline.  No significant electrolyte abnormalities.  CT stone study with a right sided proximal kidney stone.  I do not think this explains the patient's left-sided low back pain.  I think that pain is still more likely to be musculoskeletal.  However the patient does have a right-sided  kidney stone so we will start him on treatment for that.  Have him follow-up with urology in the office.  I reevaluated the patient and he is feeling a bit better.  Will discharge home.  PCP follow-up.  12:44 PM:  I have discussed the diagnosis/risks/treatment options with the patient.  Evaluation and diagnostic testing in the emergency department does not suggest an emergent condition requiring admission or immediate intervention beyond what has been performed at this time.  They will follow up with PCP. We also discussed returning to the ED immediately if new or worsening sx occur. We discussed the sx which are most concerning (e.g., sudden worsening pain, fever, inability to tolerate by mouth) that necessitate immediate return. Medications administered to the patient during their visit and any new prescriptions provided to the patient are listed below.  Medications given during this visit Medications  ketorolac  (TORADOL ) 15 MG/ML injection 15 mg (has no administration in time range)  sodium chloride  0.9 % bolus 1,000 mL (1,000 mLs Intravenous New Bag/Given 12/27/23 1052)  morphine  (PF) 4 MG/ML injection 4 mg (4 mg Intravenous Given 12/27/23 1050)  ondansetron  (ZOFRAN ) injection 4 mg (4 mg Intravenous Given 12/27/23 1050)     The patient appears reasonably screen and/or stabilized for discharge and I doubt any other medical condition or other [facility] requiring further screening, evaluation, or treatment in the ED at this time prior to discharge.       Final diagnoses:  Acute left-sided low back pain without sciatica  Ureterolithiasis    ED Discharge Orders          Ordered    morphine  (MSIR) 15 MG tablet  Every 4 hours PRN        12/27/23 1240    tamsulosin  (FLOMAX ) 0.4 MG CAPS capsule  Daily after supper        12/27/23 1240               [place], DO 12/27/23 1244

## 2023-12-27 NOTE — ED Triage Notes (Signed)
 Patient c/o back pain x 2 days Denies injury, states it just happened suddenly when reaching for something Reports he does not lift heavy objects Pain rated 10/10 Patient says 7 years ago he had kidney stone at surgery at this hospital Says pain feels different this time

## 2023-12-27 NOTE — Discharge Instructions (Signed)
 You do have a right-sided kidney stone on your CT scan.  This needs to be follow-up with a urologist in the office.  Please call the phone number in the paperwork on Monday to try and set up an appointment.  Please return for fever or uncontrolled pain.  Your left sided back pain is most likely due to a muscular strain.  There is been a lot of research on back pain, unfortunately the only thing that seems to really help is Tylenol  and ibuprofen.  Relative rest is also important to not lift greater than 10 pounds bending or twisting at the waist.  Please follow-up with your family physician.  The other thing that really seems to benefit patients is physical therapy which your doctor may send you for.  Please return to the emergency department for new numbness or weakness to your arms or legs. Difficulty with urinating or urinating or pooping on yourself.  Also if you cannot feel toilet paper when you wipe or get a fever.   Take 4 over the counter ibuprofen tablets 3 times a day or 2 over-the-counter naproxen tablets twice a day for pain. Also take tylenol  1000mg (2 extra strength) four times a day.   Then take the pain medicine if you feel like you need it. Narcotics do not help with the pain, they only make you care about it less.  You can become addicted to this, people may break into your house to steal it.  It will constipate you.  If you drive under the influence of this medicine you can get a DUI.    ??????? CT scan ??? ?????????? ??????????????? ????????? ????? ??????? ?????????????????? ??????????? ?????????? ????????? ??????????? ?????????????? ????????????????? ?????????????? ???????? ?????????????? ??????????? ??????????? ????? ????????? ?????????? ?????????? ?????????  ??????????????? ????????????? ???????????? ???????????????? ?????????????????????? ??????????????? ????????????? ?????????????? ????????????????? ????????? ??????????????????? ???????????? ?????? Tylenol  ????? ibuprofen  ???????????? Relative rest ??? ??????? ?????????????? ????????? ???????????? 10 ?????????????????????? ?????????????? ?????????????????????? ??????????????????????? ????????????? ???????????????????????? ??????????? ???????????? ?????????????????????? ????????????????? ??????? ????????? ???????????????? ???????????? ????????? ????????????????????? ??????????????? ??????? ???????????? ????????? ???????????? ????????? ????????????????? ??????????? ???????? ?????????????? ?????????????????? ???????????????????? ??????????  Ibuprofen ??????? ? ??????? ?????? ? ????? ????????? ?????????????? ??????????? naproxen ??????? ? ???? ?????? ? ????? ???????? Tylenol  1000mg  (2 ???????????????) ?????? ????????????????  ????????? ?????????? ????????? ????????????????????? ???????? ???????????? ???????????? ?????????????? ???????????? ??????????? ???? ????? ??????????????????? ? ?????? ????????????? ?????????????????? ??????? ?????????????????????? ????? ?????? ????? ?????????????????????? ????????????? DUI ????????????? mainnrae CT scan mhar  nyarbhaathkyam  kyawwatkautkyawwat  shinaytaal .  darko ronemhar  see aahtuukusararwaannae noutsaattwal hcaitsayyhphoet  lopartaal .  kyaayyjuupyu  tanainlarnaetwin  hcarrwat hcartaann parsaw hponenanparatko  hkawso  raathkyaneyuuraan  kyaoehcarr par .  wing aahpyarr shoetmahote  hteinmarasaw  waydanaraatwat  pyanlar par .   sang bhaalbhaathkyam  hkarr narhkyinnsai  kyawatsarrmyarr  tainnmarmhukyawwng  hpyitninehkyaay myarrparsai .   hkarr nar hkyinnaatwat  sutaysanamyarrhcwar  pyulotehtarrpyee  kan m kaungghcwarhpyang aamhaantakaal  aahtoutaakuu hpyithcaymany taithkutaeesaw  aararmhar Tylenol  nhang ibuprofen  thoethpyitsai .  Relative rest sai  hkarrtwin  kway nywhaathkyinn shoetmahote  lain hkyinnhtaat 10  paung htaat m kyee hcayraanlaee  aarayykyeeparsai .   sang misarrhcusararwaannhang  noutsaattwal lotesaung par .  natale  aamhaantakaal aakyoeshi hcaymany   aahkyarr aararmhar  sang sararwaanmha  sang htan payyphoet ninesany  kar y canada hpyitsai .   sanglaat shoetmahote  hkyayhtoutmyarrtwin  htone kyainhkyinn shoetmahote  aarrnaee hkyinn aasaitaatwat  aarayypawhtarnashoet  pyan par .  seeswarrhkyinn shoetmahote  seeswarrhkyinn shoetmahote  see swarrhkyinnaatwat  hkaathkellhkyinn .  daraapyin  aahpyarr taattaeaahkar  aainsar sone hcakkuuko  m  hkanhcarrr bhuusorainlaee  sote payypar .   Ibuprofen sayypyarr  4  pyarrko taitnae  3 kyaain shoetmahote  narkyinmhuaatwat kaungtartwin naproxen sayypyarr  2 pyarr taitnae  2 kyaain  sout par . Tylenol  1000mg  (2  a po aahcwm satti) taitnae  layykyaain sout par .   hthoetnout [employer] park  a [employer]  sout par .  muuyaitsayysai narkyinmhuko  saatsar hcay ronesarmak narkyinmhuko  saatsar hcaysai .  mainn darko  hcwal laann swarrninetaal  , luutwayk  mainnaainhtellko  hkoe wainlar ninetaal .   sangko  wam hkyaote hcay par lim maal .  aakaal sainsai  i sayyeat  lwhammoemhuaouttwin  maunggnhain park DUI  rashi ninesai .

## 2023-12-30 DIAGNOSIS — R1084 Generalized abdominal pain: Secondary | ICD-10-CM | POA: Diagnosis not present

## 2023-12-30 DIAGNOSIS — N202 Calculus of kidney with calculus of ureter: Secondary | ICD-10-CM | POA: Diagnosis not present

## 2024-01-14 ENCOUNTER — Other Ambulatory Visit: Payer: Self-pay | Admitting: Urology

## 2024-01-19 ENCOUNTER — Ambulatory Visit

## 2024-01-19 ENCOUNTER — Telehealth: Payer: Self-pay

## 2024-01-19 ENCOUNTER — Encounter (HOSPITAL_COMMUNITY): Payer: Self-pay | Admitting: Urology

## 2024-01-19 ENCOUNTER — Other Ambulatory Visit: Payer: Self-pay

## 2024-01-19 VITALS — BP 148/90 | HR 85 | Temp 98.5°F | Ht 68.0 in | Wt 154.2 lb

## 2024-01-19 DIAGNOSIS — Z23 Encounter for immunization: Secondary | ICD-10-CM | POA: Diagnosis not present

## 2024-01-19 DIAGNOSIS — J439 Emphysema, unspecified: Secondary | ICD-10-CM | POA: Diagnosis not present

## 2024-01-19 DIAGNOSIS — F17201 Nicotine dependence, unspecified, in remission: Secondary | ICD-10-CM | POA: Diagnosis not present

## 2024-01-19 DIAGNOSIS — J4489 Other specified chronic obstructive pulmonary disease: Secondary | ICD-10-CM

## 2024-01-19 DIAGNOSIS — R053 Chronic cough: Secondary | ICD-10-CM

## 2024-01-19 NOTE — Progress Notes (Signed)
 For Anesthesia: PCP - Elsie Sim Lent, MD  Cardiologist -   Bowel Prep reminder:  Chest x-ray -  EKG -  Stress Test -  ECHO -  Cardiac Cath -  Pacemaker/ICD device last checked: Pacemaker orders received: Device Rep notified:  Spinal Cord Stimulator:  Sleep Study -  CPAP -   Fasting Blood Sugar -  Checks Blood Sugar _____ times a day Date and result of last Hgb A1c-  Last dose of GLP1 agonist-  GLP1 instructions: Hold 7 days prior to schedule (Hold 24 hours-daily)   Last dose of SGLT-2 inhibitors-  SGLT-2 instructions: Hold 72 hours prior to surgery  Blood Thinner Instructions: Aspirin Instructions: Last Dose:  Activity level: Can go up a flight of stairs and activities of daily living without stopping and without chest pain and/or shortness of breath   Able to exercise without chest pain and/or shortness of breath   Unable to go up a flight of stairs without chest pain and/or shortness of breath     Anesthesia review:   Patient denies shortness of breath, fever, cough and chest pain at PAT appointment   Patient verbalized understanding of instructions that were reviewed over the telephone.

## 2024-01-19 NOTE — Patient Instructions (Addendum)
 We will get PFTs to test your lungs  We will see you back in the clinic in 3 months

## 2024-01-19 NOTE — Progress Notes (Signed)
 Interview completed with patient's wife (nwe Nwe Yi). She speaks fair [place] and she had a second person helping her interpret.  COVID Vaccine Completed:  Date of COVID positive in last 90 days:  PCP - Elsie Lent, MD Cardiologist - n/a  Chest x-ray - N/A EKG - N/A Stress Test - N/A ECHO - N/A Cardiac Cath - n/a Pacemaker/ICD device last checked:N/A Spinal Cord Stimulator:N/A  Bowel Prep - N/A  Sleep Study - N/A CPAP -   Fasting Blood Sugar - N/A Checks Blood Sugar _____ times a day  Last dose of GLP1 agonist-  N/A GLP1 instructions:  Do not take after     Last dose of SGLT-2 inhibitors-  N/A SGLT-2 instructions:  Do not take after     Blood Thinner Instructions: N/A Last dose:   Time: Aspirin Instructions:N/A Last Dose:  Activity level: Can go up a flight of stairs and perform activities of daily living without stopping and without symptoms of chest pain or shortness of breath.   Anesthesia review: N/A  Patient denies shortness of breath, fever, cough and chest pain at PAT appointment  Patient verbalized understanding of instructions that were given to them at the PAT appointment. Patient was also instructed that they will need to review over the PAT instructions again at home before surgery.

## 2024-01-19 NOTE — Progress Notes (Signed)
 Subjective:   PATIENT ID: George Bishop GENDER: male DOB: Oct 02, 1955, MRN: [id number]   HPI 68 year old Burmese speaking male with former tobacco use quit 2 months ago.  Patient states that he smoked between the age of 29 to the age of 69 about 2 cigarettes a day and then quit.  And then he restarted smoking at the age of 39 2 cigarettes a day until the age of 65 and quit again 2 months ago.  He initially said that he has no pulmonary complaints including cough or shortness of breath.  He states that he only has cough during flu season when he is having a common cold but otherwise feels fine.  However on further questioning, he reports that he was prescribed albuterol  by his primary care and he has used it 3 times in the past 6 months.  He states that the last time he used it was 2 weeks ago when he felt some chest tightness and wheezing before going to sleep and he took a puff.  Currently denies any symptoms at all, he denies any cough or shortness of breath, in addition he denies any fevers or chills or night sweats.  He denies any loss of appetite or weight loss.  I congratulated him on quitting smoking.  Past Medical History:  Diagnosis Date   Aortic atherosclerosis (HCC)    per CT 08-29-209   Bilateral ureteral calculi    History of kidney stones    Nephrolithiasis    bilateral nonobstructive per CT 01-01-2018     History reviewed. No pertinent family history.   Social History   Socioeconomic History   Marital status: Married    Spouse name: Not on file   Number of children: Not on file   Years of education: Not on file   Highest education level: Not on file  Occupational History   Not on file  Tobacco Use   Smoking status: Former    Current packs/day: 0.00    Types: Cigarettes    Quit date: 09/2023    Years since quitting: 0.3   Smokeless tobacco: Never   Tobacco comments:    01-16-2018  per pt 1ppmonth  Vaping Use   Vaping status: Never Used  Substance and  Sexual Activity   Alcohol use: No   Drug use: Yes    Types: Marijuana    Comment: 01-16-2018  per pt occasional   Sexual activity: Not on file  Other Topics Concern   Not on file  Social History Narrative   Not on file   Social Drivers of Health   Financial Resource Strain: Low Risk  (09/02/2022)   Overall Financial Resource Strain (CARDIA)    Difficulty of Paying Living Expenses: Not hard at all  Food Insecurity: No Food Insecurity (09/02/2022)   Hunger Vital Sign    Worried About Running Out of Food in the Last Year: Never true    Ran Out of Food in the Last Year: Never true  Transportation Needs: No Transportation Needs (09/02/2022)   PRAPARE - Administrator, Civil Service (Medical): No    Lack of Transportation (Non-Medical): No  Physical Activity: Sufficiently Active (09/02/2022)   Exercise Vital Sign    Days of Exercise per Week: 2 days    Minutes of Exercise per Session: 90 min  Stress: No Stress Concern Present (09/02/2022)   [employer] of Occupational Health - Occupational Stress Questionnaire    Feeling of Stress : Only a little  Social Connections: Not on file  Intimate Partner Violence: Not on file     No Known Allergies   Outpatient Medications Prior to Visit  Medication Sig Dispense Refill   acetaminophen  (TYLENOL ) 500 MG tablet Take by mouth.     fluticasone  (FLONASE ) 50 MCG/ACT nasal spray Place 1 spray into both nostrils daily. 15.8 mL 1   lisinopril  (ZESTRIL ) 20 MG tablet TAKE 1 TABLET(20 MG) BY MOUTH DAILY 90 tablet 3   morphine  (MSIR) 15 MG tablet Take 0.5 tablets (7.5 mg total) by mouth every 4 (four) hours as needed for severe pain (pain score 7-10). 5 tablet 0   nystatin -triamcinolone  ointment (MYCOLOG) Apply 1 Application topically 2 (two) times daily. 60 g 0   tamsulosin  (FLOMAX ) 0.4 MG CAPS capsule Take 1 capsule (0.4 mg total) by mouth daily after supper. 30 capsule 0   metFORMIN  (GLUCOPHAGE -XR) 500 MG 24 hr tablet Take 1 tablet  (500 mg total) by mouth at bedtime. (Patient not taking: Reported on 01/19/2024) 90 tablet 1   methocarbamol  (ROBAXIN ) 500 MG tablet Take 1 tablet (500 mg total) by mouth every 8 (eight) hours as needed for muscle spasms. (Patient not taking: Reported on 01/19/2024) 40 tablet 0   pravastatin  (PRAVACHOL ) 10 MG tablet Take 1 tablet (10 mg total) by mouth at bedtime. (Patient not taking: Reported on 01/19/2024) 90 tablet 2   No facility-administered medications prior to visit.    ROS Reviewed all systems and reported negative except as above     Objective:   Vitals:   01/19/24 1309  BP: (!) 148/90  Pulse: 85  Temp: 98.5 F (36.9 C)  TempSrc: Oral  SpO2: 96%  Weight: 154 lb 3.2 oz (69.9 kg)  Height: 5' 8 (1.727 m)    Physical Exam     CBC    Component Value Date/Time   WBC 3.9 (L) 12/27/2023 1035   RBC 4.96 12/27/2023 1035   HGB 14.5 12/27/2023 1035   HCT 45.1 12/27/2023 1035   PLT 141 (L) 12/27/2023 1035   MCV 90.9 12/27/2023 1035   MCH 29.2 12/27/2023 1035   MCHC 32.2 12/27/2023 1035   RDW 13.9 12/27/2023 1035   LYMPHSABS 1.1 12/27/2023 1035   MONOABS 0.3 12/27/2023 1035   EOSABS 0.4 12/27/2023 1035   BASOSABS 0.0 12/27/2023 1035     Chest imaging:  PFT:     No data to display              Assessment & Plan:   Assessment & Plan Chronic cough Patient with concern for COPD.  Plan to obtain PFTs.  Smoking history although pack-year smoking is limited if it is true that he only smokes 2 cigarettes a day.  I advised him to continue albuterol  in the meantime.  I will provide him with a flu shot today.  Based on PFTs, can escalate treatment for COPD if he does have evidence of obstruction.  If no evidence of obstruction we will seek alternative causes of cough although at this time, the patient denies any cough symptoms.  He does not qualify for lung cancer screening based on his pack-year smoking history. COPD with chronic bronchitis and emphysema  (HCC) Will rule out COPD with PFTs.  Does not qualify for lung cancer screening.  Continue albuterol  for now.  No orders of the defined types were placed in this encounter.     Zola Herter, MD [facility] Office: [phone]

## 2024-01-19 NOTE — Telephone Encounter (Signed)
 LVM for pt to call the office to get scheduled with Corean Geralds, PA for acute visit today 01/19/24, or another provider in the office this week. Acute visit scheduled with Corean on 01/26/24 for itchy hands.

## 2024-01-20 NOTE — Anesthesia Preprocedure Evaluation (Signed)
 Anesthesia Evaluation  Patient identified by MRN, date of birth, ID band Patient awake    Reviewed: Allergy & Precautions, NPO status , Patient's Chart, lab work & pertinent test results  Airway Mallampati: I  TM Distance: >3 FB     Dental  (+) Dental Advisory Given   Pulmonary former smoker   Pulmonary exam normal breath sounds clear to auscultation       Cardiovascular hypertension, Pt. on medications + Peripheral Vascular Disease   Rhythm:Regular Rate:Normal     Neuro/Psych  Headaches  Neuromuscular disease  negative psych ROS   GI/Hepatic negative GI ROS, Neg liver ROS,,,  Endo/Other  negative endocrine ROS    Renal/GU Renal diseaseRight ureteral and renal calculus  negative genitourinary   Musculoskeletal negative musculoskeletal ROS (+)    Abdominal   Peds  Hematology Thrombocytopenia- Plt 141k   Anesthesia Other Findings   Reproductive/Obstetrics                              Anesthesia Physical Anesthesia Plan  ASA: 2  Anesthesia Plan: General   Post-op Pain Management: Dilaudid  IV, Minimal or no pain anticipated, Precedex and Ofirmev  IV (intra-op)*   Induction:   PONV Risk Score and Plan: 4 or greater and Treatment may vary due to age or medical condition, Ondansetron  and Dexamethasone   Airway Management Planned: LMA  Additional Equipment: None  Intra-op Plan:   Post-operative Plan: Extubation in OR  Informed Consent: I have reviewed the patients History and Physical, chart, labs and discussed the procedure including the risks, benefits and alternatives for the proposed anesthesia with the patient or authorized representative who has indicated his/her understanding and acceptance.     Dental advisory given  Plan Discussed with: Anesthesiologist and CRNA  Anesthesia Plan Comments:          Anesthesia Quick Evaluation

## 2024-01-21 ENCOUNTER — Ambulatory Visit (HOSPITAL_COMMUNITY)

## 2024-01-21 ENCOUNTER — Encounter (HOSPITAL_COMMUNITY): Payer: Self-pay | Admitting: Urology

## 2024-01-21 ENCOUNTER — Ambulatory Visit (HOSPITAL_COMMUNITY)
Admission: RE | Admit: 2024-01-21 | Discharge: 2024-01-21 | Disposition: A | Discharge status: Home / Self Care | Attending: Urology | Admitting: Urology

## 2024-01-21 ENCOUNTER — Ambulatory Visit (HOSPITAL_COMMUNITY): Payer: Self-pay

## 2024-01-21 ENCOUNTER — Ambulatory Visit (HOSPITAL_BASED_OUTPATIENT_CLINIC_OR_DEPARTMENT_OTHER): Payer: Self-pay

## 2024-01-21 ENCOUNTER — Encounter (HOSPITAL_COMMUNITY)
Admission: RE | Disposition: A | Discharge status: Home / Self Care | Payer: Self-pay | Source: Home / Self Care | Attending: Urology

## 2024-01-21 DIAGNOSIS — I739 Peripheral vascular disease, unspecified: Secondary | ICD-10-CM | POA: Insufficient documentation

## 2024-01-21 DIAGNOSIS — Z79899 Other long term (current) drug therapy: Secondary | ICD-10-CM | POA: Insufficient documentation

## 2024-01-21 DIAGNOSIS — I10 Essential (primary) hypertension: Secondary | ICD-10-CM | POA: Insufficient documentation

## 2024-01-21 DIAGNOSIS — N132 Hydronephrosis with renal and ureteral calculous obstruction: Secondary | ICD-10-CM | POA: Insufficient documentation

## 2024-01-21 DIAGNOSIS — N202 Calculus of kidney with calculus of ureter: Secondary | ICD-10-CM | POA: Diagnosis not present

## 2024-01-21 DIAGNOSIS — Z87891 Personal history of nicotine dependence: Secondary | ICD-10-CM | POA: Diagnosis not present

## 2024-01-21 HISTORY — DX: Allergy, unspecified, initial encounter: T78.40XA

## 2024-01-21 HISTORY — DX: Snoring: R06.83

## 2024-01-21 HISTORY — DX: Headache, unspecified: R51.9

## 2024-01-21 SURGERY — CYSTOSCOPY/URETEROSCOPY/HOLMIUM LASER/STENT PLACEMENT
Anesthesia: General | Laterality: Right

## 2024-01-21 MED ORDER — PHENYLEPHRINE 80 MCG/ML (10ML) SYRINGE FOR IV PUSH (FOR BLOOD PRESSURE SUPPORT)
PREFILLED_SYRINGE | INTRAVENOUS | Status: DC | PRN
  Administered 2024-01-21 (×3): 80 ug via INTRAVENOUS

## 2024-01-21 MED ORDER — CEFAZOLIN SODIUM-DEXTROSE 2-4 GM/100ML-% IV SOLN
2.0000 g | INTRAVENOUS | Status: AC
Start: 2024-01-21 — End: 2024-01-21
  Administered 2024-01-21: 2 g via INTRAVENOUS
  Filled 2024-01-21: qty 100

## 2024-01-21 MED ORDER — SODIUM CHLORIDE 0.9 % IR SOLN
Status: DC | PRN
  Administered 2024-01-21: 3000 mL

## 2024-01-21 MED ORDER — LIDOCAINE HCL (PF) 2 % IJ SOLN
INTRAMUSCULAR | Status: DC | PRN
  Administered 2024-01-21: 60 mg via INTRADERMAL

## 2024-01-21 MED ORDER — PROPOFOL 10 MG/ML IV BOLUS
INTRAVENOUS | Status: DC | PRN
  Administered 2024-01-21: 150 mg via INTRAVENOUS

## 2024-01-21 MED ORDER — OXYCODONE HCL 5 MG/5ML PO SOLN: 5.0000 mg | Freq: Once | ORAL | Status: AC | PRN

## 2024-01-21 MED ORDER — IOHEXOL 300 MG/ML  SOLN
INTRAMUSCULAR | Status: DC | PRN
Start: 2024-01-21 — End: 2024-01-21
  Administered 2024-01-21: 17 mL

## 2024-01-21 MED ORDER — PHENAZOPYRIDINE HCL 200 MG PO TABS
200.0000 mg | ORAL_TABLET | Freq: Three times a day (TID) | ORAL | 0 refills | Status: DC | PRN
Start: 2024-01-21 — End: 2024-02-09

## 2024-01-21 MED ORDER — DEXAMETHASONE SODIUM PHOSPHATE 10 MG/ML IJ SOLN
INTRAMUSCULAR | Status: AC
  Filled 2024-01-21: qty 1

## 2024-01-21 MED ORDER — DROPERIDOL 2.5 MG/ML IJ SOLN: 0.6250 mg | Freq: Once | INTRAMUSCULAR | Status: DC | PRN

## 2024-01-21 MED ORDER — FENTANYL CITRATE (PF) 100 MCG/2ML IJ SOLN
INTRAMUSCULAR | Status: AC
  Filled 2024-01-21: qty 2

## 2024-01-21 MED ORDER — PHENYLEPHRINE 80 MCG/ML (10ML) SYRINGE FOR IV PUSH (FOR BLOOD PRESSURE SUPPORT)
PREFILLED_SYRINGE | INTRAVENOUS | Status: AC
Start: 2024-01-21 — End: 2024-01-21
  Filled 2024-01-21: qty 10

## 2024-01-21 MED ORDER — DEXAMETHASONE SODIUM PHOSPHATE 10 MG/ML IJ SOLN
INTRAMUSCULAR | Status: DC | PRN
  Administered 2024-01-21: 4 mg via INTRAVENOUS

## 2024-01-21 MED ORDER — CEPHALEXIN 500 MG PO CAPS: 500.0000 mg | ORAL_CAPSULE | Freq: Two times a day (BID) | ORAL | 0 refills | Status: AC

## 2024-01-21 MED ORDER — OXYBUTYNIN CHLORIDE 5 MG PO TABS: 5.0000 mg | ORAL_TABLET | Freq: Three times a day (TID) | ORAL | 1 refills | Status: DC | PRN

## 2024-01-21 MED ORDER — LIDOCAINE HCL (PF) 2 % IJ SOLN
INTRAMUSCULAR | Status: AC
Start: 2024-01-21 — End: 2024-01-21
  Filled 2024-01-21: qty 5

## 2024-01-21 MED ORDER — PROPOFOL 10 MG/ML IV BOLUS
INTRAVENOUS | Status: AC
Start: 2024-01-21 — End: 2024-01-21
  Filled 2024-01-21: qty 20

## 2024-01-21 MED ORDER — FENTANYL CITRATE (PF) 100 MCG/2ML IJ SOLN
INTRAMUSCULAR | Status: DC | PRN
  Administered 2024-01-21: 50 ug via INTRAVENOUS
  Administered 2024-01-21: 25 ug via INTRAVENOUS
  Administered 2024-01-21: 50 ug via INTRAVENOUS
  Administered 2024-01-21: 25 ug via INTRAVENOUS

## 2024-01-21 MED ORDER — OXYCODONE-ACETAMINOPHEN 5-325 MG PO TABS: 1.0000 | ORAL_TABLET | ORAL | 0 refills | Status: DC | PRN

## 2024-01-21 MED ORDER — ONDANSETRON HCL 4 MG/2ML IJ SOLN
INTRAMUSCULAR | Status: AC
  Filled 2024-01-21: qty 2

## 2024-01-21 MED ORDER — ORAL CARE MOUTH RINSE: 15.0000 mL | Freq: Once | OROMUCOSAL | Status: AC

## 2024-01-21 MED ORDER — HYDROMORPHONE HCL 1 MG/ML IJ SOLN: 0.2500 mg | INTRAMUSCULAR | Status: DC | PRN

## 2024-01-21 MED ORDER — ONDANSETRON HCL 4 MG/2ML IJ SOLN
INTRAMUSCULAR | Status: DC | PRN
  Administered 2024-01-21: 4 mg via INTRAVENOUS

## 2024-01-21 MED ORDER — 0.9 % SODIUM CHLORIDE (POUR BTL) OPTIME
TOPICAL | Status: DC | PRN
  Administered 2024-01-21: 1000 mL

## 2024-01-21 MED ORDER — ONDANSETRON HCL 4 MG/2ML IJ SOLN: 4.0000 mg | Freq: Once | INTRAMUSCULAR | Status: DC | PRN

## 2024-01-21 MED ORDER — LACTATED RINGERS IV SOLN: INTRAVENOUS | Status: DC

## 2024-01-21 MED ORDER — OXYCODONE HCL 5 MG PO TABS
ORAL_TABLET | ORAL | Status: AC
  Filled 2024-01-21: qty 1

## 2024-01-21 MED ORDER — OXYCODONE HCL 5 MG PO TABS
5.0000 mg | ORAL_TABLET | Freq: Once | ORAL | Status: AC | PRN
  Administered 2024-01-21: 5 mg via ORAL

## 2024-01-21 MED ORDER — CHLORHEXIDINE GLUCONATE 0.12 % MT SOLN
15.0000 mL | Freq: Once | OROMUCOSAL | Status: AC
  Administered 2024-01-21: 15 mL via OROMUCOSAL

## 2024-01-21 SURGICAL SUPPLY — 19 items
BAG URO CATCHER STRL LF (MISCELLANEOUS) ×1 IMPLANT
BASKET ZERO TIP NITINOL 2.4FR (BASKET) IMPLANT
CATH URETL OPEN 5X70 (CATHETERS) ×1 IMPLANT
CLOTH BEACON ORANGE TIMEOUT ST (SAFETY) ×1 IMPLANT
EXTRACTOR STONE NITINOL NGAGE (UROLOGICAL SUPPLIES) IMPLANT
FIBER LASER MOSES 200 DFL (Laser) IMPLANT
GLOVE SURG LX STRL 8.0 MICRO (GLOVE) ×1 IMPLANT
GOWN STRL SURGICAL XL XLNG (GOWN DISPOSABLE) ×1 IMPLANT
GUIDEWIRE STR DUAL SENSOR (WIRE) IMPLANT
GUIDEWIRE ZIPWRE .038 STRAIGHT (WIRE) ×1 IMPLANT
KIT TURNOVER KIT A (KITS) ×1 IMPLANT
MANIFOLD NEPTUNE II (INSTRUMENTS) ×1 IMPLANT
PACK CYSTO (CUSTOM PROCEDURE TRAY) ×1 IMPLANT
SHEATH NAVIGATOR HD 11/13X28 (SHEATH) IMPLANT
SHEATH NAVIGATOR HD 11/13X36 (SHEATH) IMPLANT
STENT URET 6FRX24 CONTOUR (STENTS) IMPLANT
STENT URET 6FRX26 CONTOUR (STENTS) IMPLANT
TUBING CONNECTING 10 (TUBING) ×1 IMPLANT
TUBING UROLOGY SET (TUBING) ×1 IMPLANT

## 2024-01-21 NOTE — Transfer of Care (Signed)
 Immediate Anesthesia Transfer of Care Note  Patient: George Bishop  Procedure(s) Performed: CYSTOSCOPY/URETEROSCOPY/HOLMIUM LASER/STENT PLACEMENT (Right)  Patient Location: PACU  Anesthesia Type:General  Level of Consciousness: awake and patient cooperative  Airway & Oxygen Therapy: Patient Spontanous Breathing and Patient connected to face mask oxygen  Post-op Assessment: Report given to RN and Post -op Vital signs reviewed and stable  Post vital signs: Reviewed and stable  Last Vitals:  Vitals Value Taken Time  BP 146/95 01/21/24 10:12  Temp 97.5 F Ax+ 01/21/24 10:14  Pulse 82 01/21/24 10:13  Resp 14 01/21/24 10:13  SpO2 100 % 01/21/24 10:13  Vitals shown include unfiled device data.  Last Pain:  Vitals:   01/21/24 0707  TempSrc:   PainSc: 0-No pain         Complications: No notable events documented.

## 2024-01-21 NOTE — Op Note (Signed)
 Operative Note  Preoperative diagnosis:  1.  9 mm right UPJ stone 2.  Right renal stones  Postoperative diagnosis: 1.  Obstructing and impacted 9 mm right UPJ stone 2.  Right renal stones  Procedure(s): 1.  Cystoscopy with right ureteroscopy, holmium laser lithotripsy and right JJ stent placement 2.  Right retrograde pyelogram with intraoperative interpretation fluoroscopic imaging  Surgeon: Lonni Han, MD  Assistants:  None  Anesthesia:  General  Complications:  None  EBL: Less than 5 mL  Specimens: 1.  Right ureteral stone fragments  Drains/Catheters: 1.  Right 6 French, 24 cm JJ stent without tether  Intraoperative findings:   No intravesical urethral abnormalities were seen Right retrograde pyelogram revealed a filling defect within the proximal aspects of the right ureter, consistent with the obstructing stone seen on recent CT.  His right renal pelvis was diffusely dilated with acute angulation of the lower pole calyces.  No other filling defects are seen on retrograde pyelogram Due to acute angulation of the lower pole calyx, his lower pole stone burden was not definitively identified  Indication:  George Bishop is a 68 y.o. male with an obstructing 9 mm right UPJ stone with moderate to severe hydronephrosis along with nonobstructing right renal stones.  He has been consented for the above procedures, voices understanding and wishes to proceed.  Description of procedure:  After informed consent was obtained, the patient was brought to the operating room and general LMA anesthesia was administered. The patient was then placed in the dorsolithotomy position and prepped and draped in the usual sterile fashion. A timeout was performed. A 21 French rigid cystoscope was then inserted into the urethral meatus and advanced into the bladder under direct vision. A complete bladder survey revealed no intravesical pathology.  A 5 French ureteral catheter was then  inserted into the right ureteral orifice and a retrograde pyelogram was obtained, with the findings listed above.  A Glidewire was then used to intubate the lumen of the ureteral catheter and was advanced up to the right renal pelvis, under fluoroscopic guidance.  The catheter was then removed, leaving the wire in place.  A semirigid ureteroscope was then inserted into the bladder and advanced up the right ureter where his proximal stone was identified.  The stone was found to be severely obstructing and impacted.  A 200 m holmium laser was then used to fracture the stone into numerous smaller pieces that were then removed from the lumen of the right ureter using an engage basket.  The semirigid ureteroscope was then removed and replaced with a flexible ureteroscope, which was then advanced up the right ureter to the renal pelvis.  His right renal pelvis and all associated calyces were found to be diffusely dilated.  I could not definitively identify his lower pole stone burden that was seen on CT.  There were no sizable stones within the renal pelvis or any of the other associated calyces.  No mucosal abnormalities were seen.  The flexible ureteroscope was then removed, leaving the Glidewire in place.  A 6 French, 24 cm JJ stent was then placed over the Glidewire and into good position within the right collecting system, confirming placement via fluoroscopy.  The patient's bladder was drained and all stone fragments were removed.  He tolerated the procedure well and was transferred to the postanesthesia in stable condition.  Plan: Follow-up in 1 week for office cystoscopy and stent removal.  He will need a repeat right renal ultrasound in 6  weeks postop

## 2024-01-21 NOTE — Anesthesia Procedure Notes (Signed)
 Procedure Name: LMA Insertion Date/Time: 01/21/2024 8:45 AM  Performed by: Augusta Daved SAILOR, CRNAPre-anesthesia Checklist: Patient identified, Emergency Drugs available, Suction available and Patient being monitored Patient Re-evaluated:Patient Re-evaluated prior to induction Oxygen Delivery Method: Circle System Utilized Preoxygenation: Pre-oxygenation with 100% oxygen Induction Type: IV induction LMA: LMA inserted LMA Size: 4.0 Number of attempts: 1 Placement Confirmation: positive ETCO2 Tube secured with: Tape Dental Injury: Teeth and Oropharynx as per pre-operative assessment

## 2024-01-21 NOTE — Anesthesia Postprocedure Evaluation (Signed)
 Anesthesia Post Note  Patient: [place] Nyein Dalsanto  Procedure(s) Performed: CYSTOSCOPY/URETEROSCOPY/HOLMIUM LASER/STENT PLACEMENT (Right)     Patient location during evaluation: PACU Anesthesia Type: General Level of consciousness: awake and alert and oriented Pain management: pain level controlled Vital Signs Assessment: post-procedure vital signs reviewed and stable Respiratory status: spontaneous breathing, nonlabored ventilation and respiratory function stable Cardiovascular status: blood pressure returned to baseline and stable Postop Assessment: no apparent nausea or vomiting Anesthetic complications: no   No notable events documented.  Last Vitals:  Vitals:   01/21/24 1030 01/21/24 1045  BP: 138/82 (!) 144/92  Pulse: 79 81  Resp: 12 11  Temp:  36.5 C  SpO2: 93% 97%    Last Pain:  Vitals:   01/21/24 1045  TempSrc:   PainSc: 0-No pain                 Olanrewaju Osborn A.

## 2024-01-21 NOTE — H&P (Signed)
 Office Visit Report     12/30/2023   --------------------------------------------------------------------------------   George Bishop  MRN: [id number]  DOB: 25-Jul-1955, 68 year old Male  SSN:   PRIMARY CARE:    REFERRING:  Elsie Lent  PROVIDER:  Donnice Brooks, M.D.  TREATING:  Lonni Han, M.D.  LOCATION:  [facility] [phone]     --------------------------------------------------------------------------------   CC: I have pain in the flank.  HPI: George Bishop is a 68 year-old male established patient who is here for flank pain.  The problem is on the right side. His pain started about 12/27/2023. The pain is sharp. The pain is intermittent. The pain does radiate.   None< makes the pain better. Nothing causes the pain to become worse.   He has had this same pain previously. He has had kidney stones.   -The patient was seen in the ED on 12/27/23 due to right sided abdominal/flank pain that has since resolved  -CTSS at that time showed an obstructing 9 mm right UPJ stone associated with severe right hydronephrosis along with two right renal stones and a 3 mm left renal stone  -Hx of stones and he has required URS with Dr. Sherrilee in the past  -He denies interval episodes of nausea/vomiting, fever/chilss, dysuria or hematuria  -Labs from 8/23 were WNL.     ALLERGIES: None   MEDICATIONS: Lisinopril  20 MG Tablet  metFORMIN  HCl  Tamsulosin  HCl 0.4 MG Capsule  Acetaminophen   Flonase  Allergy Relief  Methocarbamol   Morphine  Sulfate ER 15 MG Tablet Extended Release  Nystatin -Triamcinolone   Pravastatin  Sodium 10 MG Tablet     GU PSH: Cystoscopy Insert Stent, Bilateral - 2019, Bilateral - 2019 Ureteroscopic stone removal, Bilateral - 2019     NON-GU PSH: Remove Kidney Stone - 2019     GU PMH: Renal calculus - 2019, - 2019 Kidney Failure Unspec    NON-GU PMH: Hypertension    FAMILY HISTORY: 1 Daughter - Daughter   SOCIAL HISTORY: Marital  Status: Married Preferred Language: English; Race: Asian Current Smoking Status: Patient does not smoke anymore. Smoked for 1 year.   Tobacco Use Assessment Completed: Used Tobacco in last 30 days? Has never drank.  Drinks 1 caffeinated drink per day.    REVIEW OF SYSTEMS:    GU Review Male:   Patient denies frequent urination, hard to postpone urination, burning/ pain with urination, get up at night to urinate, leakage of urine, stream starts and stops, trouble starting your stream, have to strain to urinate , erection problems, and penile pain.  Gastrointestinal (Upper):   Patient denies nausea, vomiting, and indigestion/ heartburn.  Gastrointestinal (Lower):   Patient denies constipation and diarrhea.  Constitutional:   Patient denies fever, night sweats, weight loss, and fatigue.  Skin:   Patient denies skin rash/ lesion and itching.  Eyes:   Patient denies blurred vision and double vision.  Ears/ Nose/ Throat:   Patient denies sore throat and sinus problems.  Hematologic/Lymphatic:   Patient denies swollen glands and easy bruising.  Cardiovascular:   Patient denies leg swelling and chest pains.  Respiratory:   Patient denies cough and shortness of breath.  Endocrine:   Patient denies excessive thirst.  Musculoskeletal:   Patient denies back pain and joint pain.  Neurological:   Patient denies headaches and dizziness.  Psychologic:   Patient denies depression and anxiety.   VITAL SIGNS:      12/30/2023 10:09 AM  Weight 69 lb / 31.3 kg  Height 158  in / 401.32 cm  BP 130/78 mmHg  Pulse 98 /min  Temperature 97.5 F / 36.3 C  BMI 1.9 kg/m   MULTI-SYSTEM PHYSICAL EXAMINATION:    Constitutional: Well-nourished. No physical deformities. Normally developed. Good grooming. No acute distress  Neurologic / Psychiatric: Oriented to time, oriented to place, oriented to person. No depression, no anxiety, no agitation.     Complexity of Data:  Lab Test Review:   BMP, CBC with Diff   Records Review:   Previous Hospital Records, Previous Patient Records  Urine Test Review:   Urinalysis  X-Ray Review: C.T. Abdomen/Pelvis: Reviewed Films. Reviewed Report. Discussed With Patient.    Notes:                     CLINICAL DATA: Abdominal/flank pain, stone suspected   EXAM:  CT ABDOMEN AND PELVIS WITHOUT CONTRAST; CT LUMBAR SPINE WITHOUT  CONTRAST   TECHNIQUE:  Multidetector CT imaging of the abdomen and pelvis was performed  following the standard protocol without IV contrast.   RADIATION DOSE REDUCTION: This exam was performed according to the  departmental dose-optimization program which includes automated  exposure control, adjustment of the mA and/or kV according to  patient size and/or use of iterative reconstruction technique.   COMPARISON: CT abdomen/pelvis dated 01/01/2018.   FINDINGS:  Lower chest: Mild dependent changes at the posterior lung bases.   Hepatobiliary: 1.5 cm hypoattenuating focus in the right hepatic  lobe likely represents a cyst or hemangioma. Gallbladder is  contracted and otherwise grossly unremarkable. No biliary  dilatation.   Pancreas: Unremarkable. No pancreatic ductal dilatation or  surrounding inflammatory changes.   Spleen: Normal in size without focal abnormality.   Adrenals/Urinary Tract: 9 x 7 mm calculus in the proximal right  ureter with severe right hydronephrosis. The ureter distal to this  level is normal in caliber. Bilateral nonobstructive renal calculi  measuring up to 6 mm at the right lower pole and 3 mm of the left  lower pole. No left-sided ureteral calculi or left-sided  hydronephrosis. Bladder is unremarkable.   Stomach/Bowel: Stomach and small bowel are grossly unremarkable.  Appendix appears normal. No obstruction or inflammatory changes.  Scattered colonic diverticula.   Vascular/Lymphatic: Abdominal aorta is normal in caliber with  atherosclerotic calcification. No enlarged abdominal or pelvic lymph   nodes.   Reproductive: Prostate is mildly enlarged, similar to the prior  exam.   Other: No abdominopelvic ascites. No intraperitoneal free air.   Musculoskeletal: No acute osseous abnormality. No suspicious osseous  lesion.   Lumbar spine:   Segmentation: 5 lumbar type vertebrae.   Alignment: Similar straightening of the normal cervical lordosis. No  static listhesis.   Vertebrae: Vertebral body heights are maintained. No acute fracture  or focal pathologic process.   Paraspinal and other soft tissues: Negative.   Disc levels: Mild multilevel degenerative disc height loss and  predominantly anterior endplate osteophytosis of the lumbar spine.  Disc height loss is most pronounced at L5-S1, similar to slightly  progressed since the prior exam. Left greater than right facet  arthropathy at L5-S1 contributes to moderate left foraminal  narrowing.   IMPRESSION:  1. 9 x 7 mm calculus in the proximal right ureter with severe right  hydronephrosis.  2. Bilateral nonobstructive renal calculi measuring up to 6 mm at  the right lower pole and 3 mm of the left lower pole.  3. Mild prostatomegaly.  4. Mild multilevel degenerative disc changes of the lumbar spine,  most pronounced at L5-S1 with left greater than right facet  arthropathy contributing to moderate left foraminal narrowing.  5. Aortic Atherosclerosis (ICD10-I70.0).    Electronically Signed  By: Harrietta Sherry M.D.  On: 12/27/2023 11:49     PROCEDURES:          Urinalysis w/Scope Dipstick Dipstick Cont'd Micro  Color: Yellow Bilirubin: Neg mg/dL WBC/hpf: 0 - 5/hpf  Appearance: Clear Ketones: Neg mg/dL RBC/hpf: 0 - 2/hpf  Specific Gravity: 1.015 Blood: Trace ery/uL Bacteria: Few (10-25/hpf)  pH: 6.0 Protein: Trace mg/dL Cystals: NS (Not Seen)  Glucose: Neg mg/dL Urobilinogen: 0.2 mg/dL Casts: NS (Not Seen)    Nitrites: Neg Trichomonas: Not Present    Leukocyte Esterase: 1+ leu/uL Mucous: Not Present       Epithelial Cells: NS (Not Seen)      Yeast: NS (Not Seen)      Sperm: Not Present    ASSESSMENT:      ICD-10 Details  1 GU:   Flank Pain - R10.84 Right, Undiagnosed New Problem  2   Renal and ureteral calculus - N20.2 Right, Undiagnosed New Problem  3   Ureteral obstruction secondary to calculous - N13.2 Right, Undiagnosed New Problem   PLAN:           Orders Labs Urinalysis, Urine Culture          Schedule Return Visit/Planned Activity: Next Available Appointment - Schedule Surgery          Document Letter(s):  Created for Patient: Clinical Summary   Created for Elsie Lent         Notes:   The risks, benefits and alternatives of cystoscopy with RIGHT ureteroscopy, laser lithotripsy and ureteral stent placement was discussed the patient. Risks included, but are not limited to: bleeding, urinary tract infection, ureteral injury/avulsion, ureteral stricture formation, retained stone fragments, the possibility that multiple surgeries may be required to treat the stone(s), MI, stroke, PE and the inherent risks of general anesthesia. The patient voices understanding and wishes to proceed.

## 2024-01-22 ENCOUNTER — Encounter (HOSPITAL_COMMUNITY): Payer: Self-pay | Admitting: Urology

## 2024-01-26 ENCOUNTER — Ambulatory Visit (INDEPENDENT_AMBULATORY_CARE_PROVIDER_SITE_OTHER): Admitting: Emergency Medicine

## 2024-01-26 ENCOUNTER — Encounter: Payer: Self-pay | Admitting: Emergency Medicine

## 2024-01-26 VITALS — BP 132/72 | HR 99 | Temp 98.6°F | Resp 16 | Ht 68.0 in | Wt 150.0 lb

## 2024-01-26 DIAGNOSIS — R21 Rash and other nonspecific skin eruption: Secondary | ICD-10-CM | POA: Diagnosis not present

## 2024-01-26 MED ORDER — PREDNISONE 10 MG (48) PO TBPK: ORAL_TABLET | ORAL | 0 refills | Status: DC

## 2024-01-26 MED ORDER — HYDROXYZINE HCL 25 MG PO TABS: 25.0000 mg | ORAL_TABLET | Freq: Three times a day (TID) | ORAL | 0 refills | Status: DC | PRN

## 2024-01-26 MED ORDER — CETIRIZINE HCL 10 MG PO TABS: 10.0000 mg | ORAL_TABLET | Freq: Every day | ORAL | 0 refills | Status: DC

## 2024-01-26 NOTE — Patient Instructions (Addendum)
 Prednisone  is a steroid, each day- Half the pills with breakfast, and half the pills with lunch- do not follow the instructions on the package. (????????? ????????? ????????? ??????????? ?????????????????) Cetirizine - 1 tablet every day.  Hydroxyzine - can take 1 tablet for itching up to three times a day (??????????????? ?????????? ?????? ? ???? ?????????????????) Recheck in 2 weeks.

## 2024-01-26 NOTE — Progress Notes (Signed)
 Subjective:  Skin Itching (Bilateral hand and left arm itching x 3 weeks. )  Interpreter Fleeta Baumgarten [phone]  HPI: George Bishop is a 68 y.o. male presenting on 01/26/2024 with report of rash Seen by PCP 1.91mo ago with rash to top of feet, was rx mycolog II, and though it helped itching to some areas he has spreading patched of itching. Denies pain to any area of the rash. The cream is no longer helping any of the spots.  Denies any new medications, skin products, or workplace exposures.  Denies fevers, joint swelling, vomiting, appetite change, or feeling unwell.  Denies prior history of similar rash.     ROS: Negative unless specifically indicated above in HPI.   Relevant past medical history reviewed and updated as indicated.   Allergies and medications reviewed and updated.   Current Outpatient Medications:    cetirizine  (ZYRTEC ) 10 MG tablet, Take 1 tablet (10 mg total) by mouth daily., Disp: 30 tablet, Rfl: 0   hydrOXYzine  (ATARAX ) 25 MG tablet, Take 1 tablet (25 mg total) by mouth 3 (three) times daily as needed for itching., Disp: 30 tablet, Rfl: 0   oxyCODONE -acetaminophen  (PERCOCET) 5-325 MG tablet, Take 1 tablet by mouth every 4 (four) hours as needed for severe pain (pain score 7-10)., Disp: 20 tablet, Rfl: 0   predniSONE  (STERAPRED UNI-PAK 48 TAB) 10 MG (48) TBPK tablet, Take as directed on taper package, Disp: 42 tablet, Rfl: 0   acetaminophen  (TYLENOL ) 500 MG tablet, Take by mouth. (Patient not taking: Reported on 01/26/2024), Disp: , Rfl:    lisinopril  (ZESTRIL ) 20 MG tablet, TAKE 1 TABLET(20 MG) BY MOUTH DAILY (Patient not taking: Reported on 01/26/2024), Disp: 90 tablet, Rfl: 3   morphine  (MSIR) 15 MG tablet, Take 0.5 tablets (7.5 mg total) by mouth every 4 (four) hours as needed for severe pain (pain score 7-10). (Patient not taking: Reported on 01/26/2024), Disp: 5 tablet, Rfl: 0   nystatin -triamcinolone  ointment (MYCOLOG), Apply 1 Application topically 2 (two) times  daily. (Patient not taking: Reported on 01/26/2024), Disp: 60 g, Rfl: 0   oxybutynin  (DITROPAN ) 5 MG tablet, Take 1 tablet (5 mg total) by mouth every 8 (eight) hours as needed for bladder spasms. (Patient not taking: Reported on 01/26/2024), Disp: 30 tablet, Rfl: 1   phenazopyridine  (PYRIDIUM ) 200 MG tablet, Take 1 tablet (200 mg total) by mouth 3 (three) times daily as needed (for pain with urination). (Patient not taking: Reported on 01/26/2024), Disp: 30 tablet, Rfl: 0   tamsulosin  (FLOMAX ) 0.4 MG CAPS capsule, Take 1 capsule (0.4 mg total) by mouth daily after supper. (Patient not taking: Reported on 01/26/2024), Disp: 30 capsule, Rfl: 0  No Known Allergies  Social History   Tobacco Use   Smoking status: Former    Current packs/day: 0.00    Types: Cigarettes    Quit date: 09/2023    Years since quitting: 0.3   Smokeless tobacco: Never   Tobacco comments:    01-16-2018  per pt 1ppmonth  Vaping Use   Vaping status: Never Used  Substance Use Topics   Alcohol use: Not Currently    Comment: rarely   Drug use: Yes    Types: Marijuana    Comment: 01-16-2018  per pt occasional, just with friends     Objective:   Vitals:   01/26/24 1258  BP: 132/72  Pulse: 99  Temp: 98.6 F (37 C)  Resp: 16  Height: 5' 8 (1.727 m)  Weight: 150 lb (68  kg)  SpO2: 94%  BMI (Calculated): 22.81     Appears well Sclera anicteric Oral mucosa moist Heart RRR Normal resp effort and excursion.  Skin rash as shown below, mixture of excoriations, annular lesions, scale. There is also pink papular rash bilateral flank.  Right hand:   Left elbow:   Left upper arm:     Feet:   Assessment & Plan:  1. Rash (Primary) - predniSONE  (STERAPRED UNI-PAK 48 TAB) 10 MG (48) TBPK tablet; Take as directed on taper package  Dispense: 42 tablet; Refill: 0 - cetirizine  (ZYRTEC ) 10 MG tablet; Take 1 tablet (10 mg total) by mouth daily.  Dispense: 30 tablet; Refill: 0 - hydrOXYzine  (ATARAX ) 25 MG tablet;  Take 1 tablet (25 mg total) by mouth 3 (three) times daily as needed for itching.  Dispense: 30 tablet; Refill: 0  No systemic sx No areas of tunneling or interdigital pruritus suggestive of scabies.  Query lichen planus Excoriations make it hard to say for certain what initial cause might be No sign at this time of superimposed bacterial skin infection Rx as above Recheck in 2 weeks, sooner recheck if new or worsening sx   Follow up plan: Return in about 2 weeks (around 02/09/2024) for to re-evaluate the rash.  Corean Geralds, MSPAS, PA-C

## 2024-01-28 DIAGNOSIS — Z960 Presence of urogenital implants: Secondary | ICD-10-CM | POA: Diagnosis not present

## 2024-01-28 DIAGNOSIS — N132 Hydronephrosis with renal and ureteral calculous obstruction: Secondary | ICD-10-CM | POA: Diagnosis not present

## 2024-01-28 DIAGNOSIS — N202 Calculus of kidney with calculus of ureter: Secondary | ICD-10-CM | POA: Diagnosis not present

## 2024-02-09 ENCOUNTER — Ambulatory Visit (INDEPENDENT_AMBULATORY_CARE_PROVIDER_SITE_OTHER): Admitting: Emergency Medicine

## 2024-02-09 ENCOUNTER — Encounter: Payer: Self-pay | Admitting: Emergency Medicine

## 2024-02-09 VITALS — BP 182/88 | HR 75 | Temp 97.4°F | Resp 97 | Ht 68.0 in | Wt 154.0 lb

## 2024-02-09 DIAGNOSIS — I10 Essential (primary) hypertension: Secondary | ICD-10-CM

## 2024-02-09 DIAGNOSIS — R21 Rash and other nonspecific skin eruption: Secondary | ICD-10-CM

## 2024-02-09 MED ORDER — HYDROXYZINE HCL 10 MG PO TABS: 10.0000 mg | ORAL_TABLET | Freq: Three times a day (TID) | ORAL | 0 refills | Status: DC | PRN

## 2024-02-09 NOTE — Assessment & Plan Note (Addendum)
 Asymptomatic elevation today. Just restarted lisinoprol 2d ago after surgery. Advised to monitor and keep appt with PCP 10/23 for recheck, sooner if not coming down.

## 2024-02-09 NOTE — Progress Notes (Signed)
 Assessment & Plan:   Assessment & Plan Rash Improved but still quite bothersome.  Did not take all of the prednisone , but remaining tablets are unlikely to result in resolution of the rash.  Declines Kenalog injection.   Orders:   Ambulatory referral to Dermatology   hydrOXYzine  (ATARAX ) 10 MG tablet; Take 1 tablet (10 mg total) by mouth 3 (three) times daily as needed for itching.  Essential hypertension Asymptomatic elevation today. Just restarted lisinoprol 2d ago after surgery. Advised to monitor and keep appt with PCP 10/23 for recheck, sooner if not coming down.      Patient Instructions  Continue taking your blood pressure medication, and watch the blood pressure at home. If the blood pressure does not start coming down in the next week, you need an appointment with Dr Berneta for a follow-up  ????????????????????? ???????????? ???????? ????????????????? ?????????????? ????????????? ????????????????? ????????????????????????? Dr Berneta ????? ?????????????????????    You should buy a bottle of cetirizine  10mg  and take 1 pill a day. This is over the counter.  Hydroxyzine  is the prescription medicine. This is three times a day for itching as needed. Ask about cash price if insurance will not pay for it.  Dermatology should call you about your referral in the next couple of days.   cetirizine  10mg  ???????????????? ?????? ?????????????????? ??? ???????????  Hydroxyzine  ??? ???????????????????? ?????????? ??????????????? ???????????????? ????????????? ????????????????? ??????  ???????????????? ?????????????????? ?????? ?????????????????????????? ?????? ????????????????   Corean Geralds, MSPAS, PA-C    Subjective:  Rash (Rash 2 wk f/u. No improvement. )    HPI: George Bishop is a 68 y.o. male presenting on 02/09/2024 with report of continued rash to bilateral hands and feet.  He was seen by me in this clinic 2 weeks ago for the same and a prednisone  prescription was  provided as well as hydroxyzine  and recommendation to take cetirizine .  Hydroxyzine  required a prior authorization and was not picked up and he did not realize he needed to get the cetirizine  so he only took the prednisone  and shows me a package but still has 20 of the originally prescribed 48 tablets remaining. His rash did improve while he took the prednisone , but when he finished taking it symptoms began to increase again.  Very itchy, but remains without any pain, fevers, or feeling unwell.    ROS: Negative unless specifically indicated above in HPI.   Relevant past medical history reviewed and updated as indicated.   Allergies and medications reviewed and updated.   Current Outpatient Medications:    hydrOXYzine  (ATARAX ) 10 MG tablet, Take 1 tablet (10 mg total) by mouth 3 (three) times daily as needed for itching., Disp: 90 tablet, Rfl: 0   predniSONE  (STERAPRED UNI-PAK 48 TAB) 10 MG (48) TBPK tablet, Take as directed on taper package, Disp: 42 tablet, Rfl: 0   acetaminophen  (TYLENOL ) 500 MG tablet, Take by mouth. (Patient not taking: Reported on 02/09/2024), Disp: , Rfl:    cetirizine  (ZYRTEC ) 10 MG tablet, Take 1 tablet (10 mg total) by mouth daily. (Patient not taking: Reported on 02/09/2024), Disp: 30 tablet, Rfl: 0   lisinopril  (ZESTRIL ) 20 MG tablet, TAKE 1 TABLET(20 MG) BY MOUTH DAILY (Patient not taking: Reported on 02/09/2024), Disp: 90 tablet, Rfl: 3   morphine  (MSIR) 15 MG tablet, Take 0.5 tablets (7.5 mg total) by mouth every 4 (four) hours as needed for severe pain (pain score 7-10). (Patient not taking: Reported on 02/09/2024), Disp: 5 tablet, Rfl: 0   nystatin -triamcinolone  ointment (MYCOLOG),  Apply 1 Application topically 2 (two) times daily. (Patient not taking: Reported on 02/09/2024), Disp: 60 g, Rfl: 0   oxybutynin  (DITROPAN ) 5 MG tablet, Take 1 tablet (5 mg total) by mouth every 8 (eight) hours as needed for bladder spasms. (Patient not taking: Reported on 02/09/2024), Disp:  30 tablet, Rfl: 1   oxyCODONE -acetaminophen  (PERCOCET) 5-325 MG tablet, Take 1 tablet by mouth every 4 (four) hours as needed for severe pain (pain score 7-10). (Patient not taking: Reported on 02/09/2024), Disp: 20 tablet, Rfl: 0   phenazopyridine  (PYRIDIUM ) 200 MG tablet, Take 1 tablet (200 mg total) by mouth 3 (three) times daily as needed (for pain with urination). (Patient not taking: Reported on 02/09/2024), Disp: 30 tablet, Rfl: 0   tamsulosin  (FLOMAX ) 0.4 MG CAPS capsule, Take 1 capsule (0.4 mg total) by mouth daily after supper. (Patient not taking: Reported on 02/09/2024), Disp: 30 capsule, Rfl: 0  No Known Allergies  Social History   Tobacco Use   Smoking status: Former    Current packs/day: 0.00    Types: Cigarettes    Quit date: 09/2023    Years since quitting: 0.4   Smokeless tobacco: Never   Tobacco comments:    01-16-2018  per pt 1ppmonth  Vaping Use   Vaping status: Never Used  Substance Use Topics   Alcohol use: Not Currently    Comment: rarely   Drug use: Yes    Types: Marijuana    Comment: 01-16-2018  per pt occasional, just with friends     Objective:   Vitals:   02/09/24 0941 02/09/24 1115  BP: (!) 178/98 (!) 182/88  Pulse: 75   Temp: (!) 97.4 F (36.3 C)   Resp: (!) 97   Height: 5' 8 (1.727 m)   Weight: 154 lb (69.9 kg)   BMI (Calculated): 23.42      Appears well, INAD Sclera anicteric Oral mucosa moist Heart RRR Normal resp effort and excursion.  Skin with rash appearance similar to prior (see previous clinical images), does appear improved slightly to all areas but no complete resolution to any area except flanks.

## 2024-02-09 NOTE — Patient Instructions (Addendum)
 Continue taking your blood pressure medication, and watch the blood pressure at home. If the blood pressure does not start coming down in the next week, you need an appointment with Dr Berneta for a follow-up  ????????????????????? ???????????? ???????? ????????????????? ?????????????? ????????????? ????????????????? ????????????????????????? Dr Berneta ????? ?????????????????????    You should buy a bottle of cetirizine  10mg  and take 1 pill a day. This is over the counter.  Hydroxyzine  is the prescription medicine. This is three times a day for itching as needed. Ask about cash price if insurance will not pay for it.  Dermatology should call you about your referral in the next couple of days.   cetirizine  10mg  ???????????????? ?????? ?????????????????? ??? ???????????  Hydroxyzine  ??? ???????????????????? ?????????? ??????????????? ???????????????? ????????????? ????????????????? ??????  ???????????????? ?????????????????? ?????? ?????????????????????????? ?????? ????????????????

## 2024-02-12 ENCOUNTER — Ambulatory Visit: Admitting: Family Medicine

## 2024-02-17 ENCOUNTER — Ambulatory Visit (INDEPENDENT_AMBULATORY_CARE_PROVIDER_SITE_OTHER): Admitting: Dermatology

## 2024-02-17 ENCOUNTER — Telehealth: Payer: Self-pay | Admitting: Family Medicine

## 2024-02-17 ENCOUNTER — Encounter: Payer: Self-pay | Admitting: Dermatology

## 2024-02-17 DIAGNOSIS — L308 Other specified dermatitis: Secondary | ICD-10-CM | POA: Diagnosis not present

## 2024-02-17 DIAGNOSIS — R21 Rash and other nonspecific skin eruption: Secondary | ICD-10-CM

## 2024-02-17 MED ORDER — TACROLIMUS 0.1 % EX OINT: TOPICAL_OINTMENT | Freq: Every day | CUTANEOUS | 0 refills | Status: DC

## 2024-02-17 MED ORDER — BETAMETHASONE DIPROPIONATE 0.05 % EX CREA: TOPICAL_CREAM | Freq: Two times a day (BID) | CUTANEOUS | 3 refills | Status: AC

## 2024-02-17 NOTE — Telephone Encounter (Signed)
 error

## 2024-02-17 NOTE — Patient Instructions (Signed)

## 2024-02-17 NOTE — Progress Notes (Signed)
   New Patient Visit   Subjective  George Bishop is a 68 y.o. male who presents for the following: Rash x3 months. Notes from visit with [facility] patient seen 01/26/24 & 02/09/24. Patient was prescribed prednisone  and hydroxyzine  at 02/09/24 visit. Patient states hydroxyzine  helps with itching, did take prednisone  and rash improved while on it but once finished rash flared again. Has also been prescribed Nystatin  and triamcinolone  ointment x4-5 months ago.   Interpreter ID: [id number]  The following portions of the chart were reviewed this encounter and updated as appropriate: medications, allergies, medical history  Review of Systems:  No other skin or systemic complaints except as noted in HPI or Assessment and Plan.  Objective  Well appearing patient in no apparent distress; mood and affect are within normal limits.  A focused examination was performed of the following areas: Arms, legs, feet  Relevant exam findings are noted in the Assessment and Plan.                 Left forearm Pink scaly edematous hyperpigmented circular plaques on arms hands wrists legs feet fingers   Assessment & Plan   RASH Left forearm Start betamethasone dipropionate 0.05% cream. Apply BID prn rash. Avoid applying to face, groin, and axilla. Use as directed. Long-term use can cause thinning of the skin.  Patient advised to not pick up from pharmacy if too expensive and call us .   Topical steroids (such as triamcinolone , fluocinolone, fluocinonide, mometasone, clobetasol, halobetasol, betamethasone, hydrocortisone) can cause thinning and lightening of the skin if they are used for too long in the same area. Your physician has selected the right strength medicine for your problem and area affected on the body. Please use your medication only as directed by your physician to prevent side effects.   Skin / nail biopsy - Left forearm Type of biopsy: tangential   Informed consent:  discussed and consent obtained   Timeout: patient name, date of birth, surgical site, and procedure verified   Procedure prep:  Patient was prepped and draped in usual sterile fashion Prep type:  Isopropyl alcohol Anesthesia: the lesion was anesthetized in a standard fashion   Anesthetic:  1% lidocaine  w/ epinephrine 1-100,000 buffered w/ 8.4% NaHCO3 Instrument used: DermaBlade   Hemostasis achieved with: pressure and aluminum chloride   Outcome: patient tolerated procedure well   Post-procedure details: sterile dressing applied and wound care instructions given   Dressing type: bandage and petrolatum    Specimen 1 - Surgical pathology Differential Diagnosis: eczema vs mycosis fungoides vs psoriasis  Check Margins: No Pink scaly edematous hyperpigmented circular plaques on arms hands wrists legs feet fingers Related Medications cetirizine  (ZYRTEC ) 10 MG tablet Take 1 tablet (10 mg total) by mouth daily. hydrOXYzine  (ATARAX ) 10 MG tablet Take 1 tablet (10 mg total) by mouth 3 (three) times daily as needed for itching. betamethasone dipropionate 0.05 % cream Apply topically 2 (two) times daily. Apply to rash until skin feels smooth, then stop tacrolimus (PROTOPIC) 0.1 % ointment Apply topically daily.  Return in about 1 month (around 03/19/2024) for eczema follow up dupixent start if needed.  I, Jacquelynn V. Wilfred, CMA, am acting as scribe for Boneta Sharps, MD.   Documentation: I have reviewed the above documentation for accuracy and completeness, and I agree with the above.  Boneta Sharps, MD

## 2024-02-18 LAB — SURGICAL PATHOLOGY

## 2024-02-20 ENCOUNTER — Ambulatory Visit: Payer: Self-pay | Admitting: Dermatology

## 2024-02-24 NOTE — Telephone Encounter (Signed)
 Per patient's request MyChart message sent with results.

## 2024-02-24 NOTE — Telephone Encounter (Signed)
-----   Message from [place] sent at 02/20/2024  1:02 PM EDT ----- Diagnosis: left forearm :       PSORIASIFORM SPONGIOTIC DERMATITIS, SEE DESCRIPTION    Plan: please call with interpreter to share that biopsy confirmed rash is eczema. We have follow up scheduled and will start Dupixent if patient is not improving. Thank you ----- Message ----- From: Interface, [facility] Sent: 02/18/2024   4:42 PM EDT To: Boneta Sharps, MD

## 2024-02-26 ENCOUNTER — Encounter: Payer: Self-pay | Admitting: Family Medicine

## 2024-02-26 ENCOUNTER — Ambulatory Visit (INDEPENDENT_AMBULATORY_CARE_PROVIDER_SITE_OTHER): Admitting: Family Medicine

## 2024-02-26 VITALS — BP 120/74 | HR 80 | Temp 95.9°F | Ht 68.0 in | Wt 154.4 lb

## 2024-02-26 DIAGNOSIS — R7303 Prediabetes: Secondary | ICD-10-CM | POA: Diagnosis not present

## 2024-02-26 DIAGNOSIS — Z1211 Encounter for screening for malignant neoplasm of colon: Secondary | ICD-10-CM | POA: Diagnosis not present

## 2024-02-26 DIAGNOSIS — L308 Other specified dermatitis: Secondary | ICD-10-CM | POA: Diagnosis not present

## 2024-02-26 DIAGNOSIS — I10 Essential (primary) hypertension: Secondary | ICD-10-CM

## 2024-02-26 LAB — BASIC METABOLIC PANEL WITH GFR
BUN: 19 mg/dL (ref 6–23)
CO2: 25 meq/L (ref 19–32)
Calcium: 9.5 mg/dL (ref 8.4–10.5)
Chloride: 102 meq/L (ref 96–112)
Creatinine, Ser: 1.02 mg/dL (ref 0.40–1.50)
GFR: 74.51 mL/min (ref >60.00)
Glucose, Bld: 88 mg/dL (ref 70–99)
Potassium: 4.1 meq/L (ref 3.5–5.1)
Sodium: 136 meq/L (ref 135–145)

## 2024-02-26 LAB — HEMOGLOBIN A1C: Hgb A1c MFr Bld: 6.3 % (ref 4.6–6.5)

## 2024-02-26 NOTE — Progress Notes (Addendum)
 Established Patient Office Visit   Subjective:  Patient ID: George Bishop, male    DOB: 1956-03-15  Age: 68 y.o. MRN: [id number]  Chief Complaint  Patient presents with   Rash    Pt presents today for a rash. Pt states he has been feeling better. It hasn't gone away completely but he feels much better     Rash   Encounter Diagnoses  Name Primary?   Other eczema Yes   Screening for colon cancer    Prediabetes    Essential hypertension    For follow-up of above.  Blood pressure well-controlled with the lisinopril  and is having no issues taking it.  Was seen at dermatology and a skin biopsy taken at that time suggest eczema.  He was given betamethasone with Diprolene cream and that has been helpful.  Quit smoking 3 months ago!  [place]mese interpreter was used.   Review of Systems  Constitutional: Negative.   HENT: Negative.    Eyes:  Negative for blurred vision, discharge and redness.  Respiratory: Negative.    Cardiovascular: Negative.   Gastrointestinal:  Negative for abdominal pain.  Genitourinary: Negative.   Musculoskeletal: Negative.  Negative for myalgias.  Skin:  Positive for rash.  Neurological:  Negative for tingling, loss of consciousness and weakness.  Endo/Heme/Allergies:  Negative for polydipsia.     Current Outpatient Medications:    betamethasone dipropionate 0.05 % cream, Apply topically 2 (two) times daily. Apply to rash until skin feels smooth, then stop, Disp: 45 g, Rfl: 3   cetirizine  (ZYRTEC ) 10 MG tablet, Take 1 tablet (10 mg total) by mouth daily., Disp: 30 tablet, Rfl: 0   hydrOXYzine  (ATARAX ) 10 MG tablet, Take 1 tablet (10 mg total) by mouth 3 (three) times daily as needed for itching., Disp: 90 tablet, Rfl: 0   lisinopril  (ZESTRIL ) 20 MG tablet, TAKE 1 TABLET(20 MG) BY MOUTH DAILY, Disp: 90 tablet, Rfl: 3   oxybutynin  (DITROPAN ) 5 MG tablet, Take 1 tablet (5 mg total) by mouth every 8 (eight) hours as needed for bladder spasms., Disp: 30 tablet,  Rfl: 1   tacrolimus (PROTOPIC) 0.1 % ointment, Apply topically daily., Disp: 30 g, Rfl: 0   tamsulosin  (FLOMAX ) 0.4 MG CAPS capsule, Take 1 capsule (0.4 mg total) by mouth daily after supper., Disp: 30 capsule, Rfl: 0   Objective:     BP 120/74   Pulse 80   Temp (!) 95.9 F (35.5 C)   Ht 5' 8 (1.727 m)   Wt 154 lb 6.4 oz (70 kg)   SpO2 94%   BMI 23.48 kg/m  BP Readings from Last 3 Encounters:  02/26/24 120/74  02/09/24 (!) 182/88  01/26/24 132/72   Wt Readings from Last 3 Encounters:  02/26/24 154 lb 6.4 oz (70 kg)  02/09/24 154 lb (69.9 kg)  01/26/24 150 lb (68 kg)      Physical Exam Constitutional:      General: He is not in acute distress.    Appearance: Normal appearance. He is not ill-appearing, toxic-appearing or diaphoretic.  HENT:     Head: Normocephalic and atraumatic.     Right Ear: External ear normal.     Left Ear: External ear normal.  Eyes:     General: No scleral icterus.       Right eye: No discharge.        Left eye: No discharge.     Extraocular Movements: Extraocular movements intact.     Conjunctiva/sclera: Conjunctivae normal.  Pulmonary:     Effort: Pulmonary effort is normal. No respiratory distress.  Skin:    General: Skin is warm and dry.      Neurological:     Mental Status: He is alert and oriented to person, place, and time.  Psychiatric:        Mood and Affect: Mood normal.        Behavior: Behavior normal.      No results found for any visits on 02/26/24.    The 10-year ASCVD risk score (Arnett DK, et al., 2019) is: 17.9%    Assessment & Plan:   Other eczema  Screening for colon cancer -     Ambulatory referral to Gastroenterology  Prediabetes -     Basic metabolic panel with GFR -     Hemoglobin A1c -     Amb Referral to Nutrition and Diabetic Education  Essential hypertension    Return in about 3 months (around 05/28/2024).  Continue betamethasone diprionate cream.  Patient instructed on how to follow-up  with dermatology.  Referral for colonoscopy.  Advised GI to use Burmese interpreter.  Advised diabetic teaching to use Burmese interpreter.  Will need pneumococcal 20 and Tdap next visit.  Continue lisinopril  20 mg for hypertension.    Elsie Sim Lent, MD

## 2024-02-27 ENCOUNTER — Ambulatory Visit: Payer: Self-pay | Admitting: Family Medicine

## 2024-03-05 NOTE — Progress Notes (Signed)
 George Bishop                                          MRN: [id number]   03/05/2024   The [facility] Quality Team Specialist reviewed this patient medical record for the purposes of chart review for care gap closure. The following were reviewed: abstraction for care gap closure-colorectal cancer screening.    [facility] Quality Team

## 2024-03-11 DIAGNOSIS — N202 Calculus of kidney with calculus of ureter: Secondary | ICD-10-CM | POA: Diagnosis not present

## 2024-03-16 ENCOUNTER — Ambulatory Visit: Admitting: Dermatology

## 2024-03-18 ENCOUNTER — Encounter: Payer: Self-pay | Admitting: Dermatology

## 2024-03-18 ENCOUNTER — Ambulatory Visit: Admitting: Dermatology

## 2024-03-18 DIAGNOSIS — Z79899 Other long term (current) drug therapy: Secondary | ICD-10-CM

## 2024-03-18 DIAGNOSIS — L2081 Atopic neurodermatitis: Secondary | ICD-10-CM

## 2024-03-18 DIAGNOSIS — Z7189 Other specified counseling: Secondary | ICD-10-CM

## 2024-03-18 MED ORDER — DUPILUMAB 300 MG/2ML ~~LOC~~ SOAJ: 600.0000 mg | SUBCUTANEOUS | Status: DC

## 2024-03-18 MED ORDER — DUPILUMAB 300 MG/2ML ~~LOC~~ SOSY
300.0000 mg | PREFILLED_SYRINGE | SUBCUTANEOUS | Status: AC
  Administered 2024-03-18 – 2024-06-08 (×6): 300 mg via SUBCUTANEOUS

## 2024-03-18 MED ORDER — DUPIXENT 300 MG/2ML ~~LOC~~ SOAJ: 300.0000 mg | SUBCUTANEOUS | 6 refills | Status: DC

## 2024-03-18 NOTE — Patient Instructions (Signed)

## 2024-03-18 NOTE — Progress Notes (Signed)
   Follow-Up Visit   Subjective  George Bishop is a 68 y.o. 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  The following portions of the chart were reviewed this encounter and updated as appropriate: medications, allergies, medical history  Review of Systems:  No other skin or systemic complaints except as noted in HPI or Assessment and Plan.  Objective  Well appearing patient in no apparent distress; mood and affect are within normal limits.   A focused examination was performed of the following areas: Right arm  Relevant exam findings are noted in the Assessment and Plan.    Assessment & Plan   ATOPIC DERMATITIS, failed betamethasone dipropionate, failed tacrolimus Eczema bx proven 02/17/24 Arms, hands, wrists, fingers, legs, feet Exam: Scaly hyperpigmented circular pink papules and plaques arms, wrist, hands, fingers, legs, feet 10-15% BSA  Chronic and persistent condition with duration or expected duration over one year. Condition is bothersome/symptomatic for patient. Currently flared.  Atopic dermatitis (eczema) is a chronic, relapsing, pruritic condition that can significantly affect quality of life. It is often associated with allergic rhinitis and/or asthma and can require treatment with topical medications, phototherapy, or in severe cases biologic injectable medication (Dupixent; Adbry) or Oral JAK inhibitors.  Treatment Plan: Discussed Dupixent and s/e Dupilumab (Dupixent) is a biologic treatment given by injection for adults and children with moderate-to-severe atopic dermatitis. Goal is control of skin condition, not cure. It is given as 2 injections at the first dose followed by 1 injection every 2 weeks thereafter.  Young children are dosed monthly.  Potential side effects include  allergic reaction, herpes infections, injection site reactions and conjunctivitis (inflammation of the eyes).  The use of Dupixent requires long term medication management, including periodic office visits.  Start Dupixent 300mg  /36ml sq injections today every 14 days Dupixent 300mg /ml sq injections today to R and L upper arms, Samples x 2 given today Lot [id number] Cont Betamethasone to aa eczema prn flares, avoid f/g/a Recommend gentle skin care.  Topical steroids (such as triamcinolone , fluocinolone, fluocinonide, mometasone, clobetasol, halobetasol, betamethasone, hydrocortisone) can cause thinning and lightening of the skin if they are used for too long in the same area. Your physician has selected the right strength medicine for your problem and area affected on the body. Please use your medication only as directed by your physician to prevent side effects.    ATOPIC NEURODERMATITIS   Related Medications dupilumab (DUPIXENT) prefilled syringe 300 mg   Return in about 2 weeks (around 04/01/2024) for nurse visit, 48m with Dr. Claudene AD f/u.  I, Grayce Saunas, RMA, am acting as scribe for Boneta Claudene, MD .   Documentation: I have reviewed the above documentation for accuracy and completeness, and I agree with the above.  Boneta Claudene, MD

## 2024-03-25 ENCOUNTER — Ambulatory Visit: Admitting: Family Medicine

## 2024-04-05 ENCOUNTER — Ambulatory Visit

## 2024-04-05 ENCOUNTER — Ambulatory Visit: Admitting: Family Medicine

## 2024-04-05 DIAGNOSIS — L2081 Atopic neurodermatitis: Secondary | ICD-10-CM | POA: Diagnosis not present

## 2024-04-05 NOTE — Progress Notes (Signed)
 Patient here today for Dupixent  injection for atopic dermatitis.   Dupixent  300mg /37mL injected into right arm. Patient tolerated injection well.   LOT: [id number] EXP: 05/05/2026  [name], RMA

## 2024-04-19 ENCOUNTER — Ambulatory Visit

## 2024-04-19 DIAGNOSIS — L2081 Atopic neurodermatitis: Secondary | ICD-10-CM | POA: Diagnosis not present

## 2024-04-19 NOTE — Progress Notes (Signed)
 Patient here today for Dupixent  injection for atopic dermatitis.    Dupixent  300mg /69mL injected into left upper arm post. Patient tolerated injection well.    LOT: [id number] EXP: 10/04/2025   Rosina Mayans, CMA, AAMA

## 2024-04-26 ENCOUNTER — Encounter

## 2024-04-26 ENCOUNTER — Ambulatory Visit

## 2024-05-11 ENCOUNTER — Ambulatory Visit

## 2024-05-11 DIAGNOSIS — L2081 Atopic neurodermatitis: Secondary | ICD-10-CM

## 2024-05-11 NOTE — Progress Notes (Signed)
 Patient here today for Dupixent  injection for atopic dermatitis.    Dupixent  300mg /66mL injected into right upper arm post. Patient tolerated injection well.    LOT: [id number] EXP: 01/04/2026   [name], RMA

## 2024-05-12 ENCOUNTER — Encounter: Payer: Self-pay | Admitting: Internal Medicine

## 2024-05-25 ENCOUNTER — Ambulatory Visit

## 2024-05-25 DIAGNOSIS — L2081 Atopic neurodermatitis: Secondary | ICD-10-CM | POA: Diagnosis not present

## 2024-05-25 NOTE — Progress Notes (Signed)
 Patient here today for Dupixent  injection for atopic dermatitis.    Dupixent  300mg /47mL injected into left upper arm post. Patient tolerated injection well.    LOT: [id number] EXP: 01/04/2026   [name], RMA

## 2024-05-28 ENCOUNTER — Ambulatory Visit: Admitting: Family Medicine

## 2024-05-28 ENCOUNTER — Encounter: Payer: Self-pay | Admitting: Family Medicine

## 2024-05-28 VITALS — BP 118/74 | HR 68 | Temp 98.2°F | Ht 68.0 in | Wt 164.4 lb

## 2024-05-28 DIAGNOSIS — I2510 Atherosclerotic heart disease of native coronary artery without angina pectoris: Secondary | ICD-10-CM | POA: Diagnosis not present

## 2024-05-28 DIAGNOSIS — Z23 Encounter for immunization: Secondary | ICD-10-CM | POA: Diagnosis not present

## 2024-05-28 DIAGNOSIS — I10 Essential (primary) hypertension: Secondary | ICD-10-CM

## 2024-05-28 DIAGNOSIS — R7303 Prediabetes: Secondary | ICD-10-CM | POA: Diagnosis not present

## 2024-05-28 DIAGNOSIS — J449 Chronic obstructive pulmonary disease, unspecified: Secondary | ICD-10-CM | POA: Diagnosis not present

## 2024-05-28 LAB — COMPREHENSIVE METABOLIC PANEL WITH GFR
ALT: 11 U/L (ref 3–53)
AST: 12 U/L (ref 5–37)
Albumin: 4.4 g/dL (ref 3.5–5.2)
Alkaline Phosphatase: 48 U/L (ref 39–117)
BUN: 17 mg/dL (ref 6–23)
CO2: 27 meq/L (ref 19–32)
Calcium: 9.6 mg/dL (ref 8.4–10.5)
Chloride: 104 meq/L (ref 96–112)
Creatinine, Ser: 1.16 mg/dL (ref 0.40–1.50)
GFR: 63.74 mL/min
Glucose, Bld: 102 mg/dL — ABNORMAL HIGH (ref 70–99)
Potassium: 3.9 meq/L (ref 3.5–5.1)
Sodium: 139 meq/L (ref 135–145)
Total Bilirubin: 0.7 mg/dL (ref 0.2–1.2)
Total Protein: 7.2 g/dL (ref 6.0–8.3)

## 2024-05-28 LAB — CBC
HCT: 46.2 % (ref 39.0–52.0)
Hemoglobin: 15.2 g/dL (ref 13.0–17.0)
MCHC: 32.9 g/dL (ref 30.0–36.0)
MCV: 89.1 fl (ref 78.0–100.0)
Platelets: 143 K/uL — ABNORMAL LOW (ref 150.0–400.0)
RBC: 5.19 Mil/uL (ref 4.22–5.81)
RDW: 14.7 % (ref 11.5–15.5)
WBC: 4.5 K/uL (ref 4.0–10.5)

## 2024-05-28 LAB — LDL CHOLESTEROL, DIRECT: Direct LDL: 109 mg/dL

## 2024-05-28 MED ORDER — RSV PRE-FUSION F A&B VAC RCMB 120 MCG/0.5ML IM SOLR: 0.5000 mL | Freq: Once | INTRAMUSCULAR | 0 refills | Status: AC

## 2024-05-28 MED ORDER — ATORVASTATIN CALCIUM 10 MG PO TABS: 10.0000 mg | ORAL_TABLET | Freq: Every day | ORAL | 3 refills | Status: AC

## 2024-05-28 NOTE — Progress Notes (Signed)
 "  Established Patient Office Visit   Subjective:  Patient ID: George Bishop, male    DOB: [birth year]  Age: 69 y.o. MRN: [id number]  Chief Complaint  Patient presents with   Medical Management of Chronic Issues    Six month follow-up     HPI Encounter Diagnoses  Name Primary?   Essential hypertension Yes   Prediabetes    Chronic obstructive pulmonary disease, unspecified COPD type (HCC)    ASCVD (arteriosclerotic cardiovascular disease)    Immunization due    For follow-up of above.  Continues lisinopril  20 for controlled blood pressure.  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  Ongoing history of COPD.  He did quit smoking a few years ago.  Status post pulmonology workup reviewed.  He did not qualify for CT screening.  He continues albuterol  inhaler as needed.  He says that he uses it once every few months.  He has had the flu shot.  He has not had the RSV.  He is retired.  Status post urology workup for hematuria.   Review of Systems  Constitutional: Negative.   HENT: Negative.    Eyes:  Negative for blurred vision, discharge and redness.  Respiratory: Negative.    Cardiovascular: Negative.   Gastrointestinal:  Negative for abdominal pain.  Genitourinary: Negative.   Musculoskeletal: Negative.  Negative for myalgias.  Skin:  Negative for rash.  Neurological:  Negative for tingling, loss of consciousness and weakness.  Endo/Heme/Allergies:  Negative for polydipsia.    Current Medications[1]   Objective:     BP 118/74   Pulse 68   Temp 98.2 F (36.8 C)   Ht 5' 8 (1.727 m)   Wt 164 lb 6.4 oz (74.6 kg)   SpO2 97%   BMI 25.00 kg/m    Physical Exam Constitutional:      General: He is not in acute distress.    Appearance: Normal appearance. He is not ill-appearing, toxic-appearing or diaphoretic.  HENT:     Head: Normocephalic and atraumatic.     Right Ear: External ear normal.     Left Ear: External ear normal.     Mouth/Throat:      Mouth: Mucous membranes are moist.     Pharynx: Oropharynx is clear. No oropharyngeal exudate or posterior oropharyngeal erythema.  Eyes:     General: No scleral icterus.       Right eye: No discharge.        Left eye: No discharge.     Extraocular Movements: Extraocular movements intact.     Conjunctiva/sclera: Conjunctivae normal.     Pupils: Pupils are equal, round, and reactive to light.  Cardiovascular:     Rate and Rhythm: Normal rate and regular rhythm.  Pulmonary:     Effort: Pulmonary effort is normal. No respiratory distress.     Breath sounds: Normal breath sounds. Decreased air movement present.  Abdominal:     General: Bowel sounds are normal.     Tenderness: There is no abdominal tenderness. There is no guarding.  Musculoskeletal:     Cervical back: No rigidity or tenderness.  Skin:    General: Skin is warm and dry.  Neurological:     Mental Status: He is alert and oriented to person, place, and time.  Psychiatric:        Mood and Affect: Mood normal.        Behavior: Behavior normal.      No results found for any visits on 05/28/24.  The 10-year ASCVD risk score (Arnett DK, et al., 2019) is: 17.4%    Assessment & Plan:   Essential hypertension -     Comprehensive metabolic panel with GFR -     CBC  Prediabetes -     Comprehensive metabolic panel with GFR  Chronic obstructive pulmonary disease, unspecified COPD type (HCC)  ASCVD (arteriosclerotic cardiovascular disease) -     Comprehensive metabolic panel with GFR -     LDL cholesterol, direct -     Atorvastatin  Calcium ; Take 1 tablet (10 mg total) by mouth daily.  Dispense: 90 tablet; Refill: 3  Immunization due -     Respiratory syncytial virus vaccine, preF, subunit, bivalent,(Abrysvo) -     RSV Pre-Fusion F A&B Vac Rcmb; Inject 0.5 mLs into the muscle once for 1 dose.  Dispense: 0.5 mL; Refill: 0    Return in about 6 months (around 11/25/2024).  Continue all medications.  Discussed the  importance of starting a statin with his history of vascular disease and history of tobacco use.  Elsie Sim Lent, MD     [1]  Current Outpatient Medications:    atorvastatin  (LIPITOR) 10 MG tablet, Take 1 tablet (10 mg total) by mouth daily., Disp: 90 tablet, Rfl: 3   betamethasone  dipropionate 0.05 % cream, Apply topically 2 (two) times daily. Apply to rash until skin feels smooth, then stop, Disp: 45 g, Rfl: 3   lisinopril  (ZESTRIL ) 20 MG tablet, TAKE 1 TABLET(20 MG) BY MOUTH DAILY, Disp: 90 tablet, Rfl: 3   RSV bivalent vaccine (ABRYSVO) 120 MCG/0.5ML injection, Inject 0.5 mLs into the muscle once for 1 dose., Disp: 0.5 mL, Rfl: 0  Current Facility-Administered Medications:    dupilumab  (DUPIXENT ) prefilled syringe 300 mg, 300 mg, Subcutaneous, Q14 Days, Claudene Lehmann, MD, 300 mg at 05/25/24 1127  "

## 2024-05-31 ENCOUNTER — Ambulatory Visit: Payer: Self-pay | Admitting: Family Medicine

## 2024-06-08 ENCOUNTER — Ambulatory Visit

## 2024-06-08 DIAGNOSIS — L2081 Atopic neurodermatitis: Secondary | ICD-10-CM | POA: Diagnosis not present

## 2024-06-08 NOTE — Progress Notes (Signed)
 Patient here today for Dupixent  injection for atopic dermatitis.    Dupixent  300mg /55mL injected into right upper arm post. Patient tolerated injection well.    LOT: [id number] EXP: 04/04/2026   [name], RMA

## 2024-06-14 ENCOUNTER — Ambulatory Visit: Admitting: Adult Health

## 2024-06-14 ENCOUNTER — Encounter

## 2024-06-17 ENCOUNTER — Ambulatory Visit: Admitting: Dermatology

## 2024-11-25 ENCOUNTER — Ambulatory Visit: Admitting: Family Medicine
